# Patient Record
Sex: Male | Born: 1970 | Race: White | Hispanic: No | Marital: Married | Smoking: Never smoker
Health system: Southern US, Community
[De-identification: ages and names within clinical notes are randomized; demographics above are authoritative.]

## PROBLEM LIST (undated history)

## (undated) DIAGNOSIS — D751 Secondary polycythemia: Secondary | ICD-10-CM

## (undated) DIAGNOSIS — I219 Acute myocardial infarction, unspecified: Secondary | ICD-10-CM

## (undated) DIAGNOSIS — E785 Hyperlipidemia, unspecified: Secondary | ICD-10-CM

## (undated) DIAGNOSIS — R7989 Other specified abnormal findings of blood chemistry: Secondary | ICD-10-CM

## (undated) DIAGNOSIS — K589 Irritable bowel syndrome without diarrhea: Secondary | ICD-10-CM

## (undated) DIAGNOSIS — E291 Testicular hypofunction: Secondary | ICD-10-CM

## (undated) DIAGNOSIS — K2970 Gastritis, unspecified, without bleeding: Secondary | ICD-10-CM

## (undated) DIAGNOSIS — G589 Mononeuropathy, unspecified: Secondary | ICD-10-CM

## (undated) DIAGNOSIS — F553 Abuse of steroids or hormones: Secondary | ICD-10-CM

## (undated) DIAGNOSIS — E786 Lipoprotein deficiency: Secondary | ICD-10-CM

## (undated) DIAGNOSIS — I2510 Atherosclerotic heart disease of native coronary artery without angina pectoris: Secondary | ICD-10-CM

## (undated) HISTORY — DX: Lipoprotein deficiency: E78.6

## (undated) HISTORY — DX: Testicular hypofunction: E29.1

## (undated) HISTORY — DX: Gastritis, unspecified, without bleeding: K29.70

## (undated) HISTORY — DX: Acute myocardial infarction, unspecified: I21.9

## (undated) HISTORY — DX: Other specified abnormal findings of blood chemistry: R79.89

## (undated) HISTORY — PX: CORONARY ANGIOPLASTY WITH STENT PLACEMENT: SHX49

## (undated) HISTORY — DX: Secondary polycythemia: D75.1

## (undated) HISTORY — PX: INGUINAL HERNIA REPAIR: SHX194

## (undated) HISTORY — DX: Abuse of steroids or hormones: F55.3

## (undated) HISTORY — DX: Irritable bowel syndrome, unspecified: K58.9

---

## 1997-08-14 HISTORY — PX: NM RENAL LASIX (ARMC HX): HXRAD1213

## 2005-06-08 ENCOUNTER — Ambulatory Visit: Payer: Self-pay | Admitting: General Practice

## 2008-11-11 ENCOUNTER — Emergency Department: Payer: Self-pay | Admitting: Emergency Medicine

## 2010-07-14 ENCOUNTER — Ambulatory Visit: Payer: Self-pay | Admitting: Gastroenterology

## 2015-03-06 ENCOUNTER — Other Ambulatory Visit: Payer: Self-pay | Admitting: Urology

## 2015-03-06 DIAGNOSIS — E291 Testicular hypofunction: Secondary | ICD-10-CM

## 2015-03-15 ENCOUNTER — Other Ambulatory Visit: Payer: BLUE CROSS/BLUE SHIELD

## 2015-03-15 DIAGNOSIS — E291 Testicular hypofunction: Secondary | ICD-10-CM

## 2015-03-16 LAB — TESTOSTERONE: Testosterone: 308 ng/dL — ABNORMAL LOW (ref 348–1197)

## 2015-03-19 ENCOUNTER — Telehealth: Payer: Self-pay | Admitting: Urology

## 2015-03-19 DIAGNOSIS — E291 Testicular hypofunction: Secondary | ICD-10-CM

## 2015-03-19 NOTE — Telephone Encounter (Signed)
Please let this patient now that his testosterone is now 308. It is making a nice recovery with clomid alone up from 180 only 3 months ago.  I recommend continuing the course and we can repeat his testosterone again in 3 months. I would like to see him after that lab draw to see where he into plateauing. This is great news!    Hollice Espy, MD

## 2015-03-19 NOTE — Telephone Encounter (Signed)
LMOM

## 2015-03-20 NOTE — Telephone Encounter (Signed)
Pt returned call. Made him aware of lab results. Pt was very unhappy with continuing clomid therapy. Pt voiced concern about testing his testosterone every 74mo and then having to f/u. Pt stated this was the plan 9mo ago and the 101mo prior to that, therefore he wants to find another urologist.

## 2015-03-20 NOTE — Telephone Encounter (Signed)
Thanks totally fine.  I am happy to refer him elsewhere if he would like or he is welcome to come in and have a discussion in person if he would prefer.  Does he want to go to [place]?  We can refer him to [facility] or any other academic urology practice and send records.    Hollice Espy, MD

## 2015-03-20 NOTE — Telephone Encounter (Signed)
Spoke with pt and made aware of being able to refer him or make an appt to speak with Dr. Erlene Quan. Pt stated that he would call back when he makes a final decision. Cw,lpn

## 2015-09-08 ENCOUNTER — Encounter (HOSPITAL_COMMUNITY)
Admission: EM | Disposition: A | Discharge status: Home / Self Care | Payer: Self-pay | Source: Home / Self Care | Attending: Cardiology

## 2015-09-08 ENCOUNTER — Inpatient Hospital Stay (HOSPITAL_COMMUNITY)
Admission: EM | Admit: 2015-09-08 | Discharge: 2015-09-10 | DRG: 247 | Disposition: A | Discharge status: Home / Self Care | Payer: BLUE CROSS/BLUE SHIELD | Attending: Cardiology | Admitting: Cardiology

## 2015-09-08 ENCOUNTER — Encounter (HOSPITAL_COMMUNITY): Payer: Self-pay | Admitting: Physician Assistant

## 2015-09-08 ENCOUNTER — Inpatient Hospital Stay (HOSPITAL_COMMUNITY): Payer: BLUE CROSS/BLUE SHIELD

## 2015-09-08 DIAGNOSIS — I472 Ventricular tachycardia: Secondary | ICD-10-CM | POA: Diagnosis not present

## 2015-09-08 DIAGNOSIS — E785 Hyperlipidemia, unspecified: Secondary | ICD-10-CM | POA: Diagnosis present

## 2015-09-08 DIAGNOSIS — I213 ST elevation (STEMI) myocardial infarction of unspecified site: Secondary | ICD-10-CM

## 2015-09-08 DIAGNOSIS — I2510 Atherosclerotic heart disease of native coronary artery without angina pectoris: Secondary | ICD-10-CM | POA: Diagnosis not present

## 2015-09-08 DIAGNOSIS — R030 Elevated blood-pressure reading, without diagnosis of hypertension: Secondary | ICD-10-CM | POA: Diagnosis present

## 2015-09-08 DIAGNOSIS — E7849 Other hyperlipidemia: Secondary | ICD-10-CM | POA: Diagnosis present

## 2015-09-08 DIAGNOSIS — I2119 ST elevation (STEMI) myocardial infarction involving other coronary artery of inferior wall: Secondary | ICD-10-CM | POA: Diagnosis present

## 2015-09-08 DIAGNOSIS — IMO0001 Reserved for inherently not codable concepts without codable children: Secondary | ICD-10-CM

## 2015-09-08 DIAGNOSIS — I2111 ST elevation (STEMI) myocardial infarction involving right coronary artery: Secondary | ICD-10-CM | POA: Diagnosis not present

## 2015-09-08 DIAGNOSIS — Z880 Allergy status to penicillin: Secondary | ICD-10-CM

## 2015-09-08 DIAGNOSIS — N179 Acute kidney failure, unspecified: Secondary | ICD-10-CM | POA: Diagnosis present

## 2015-09-08 DIAGNOSIS — Z8249 Family history of ischemic heart disease and other diseases of the circulatory system: Secondary | ICD-10-CM | POA: Diagnosis not present

## 2015-09-08 DIAGNOSIS — R079 Chest pain, unspecified: Secondary | ICD-10-CM | POA: Diagnosis not present

## 2015-09-08 DIAGNOSIS — R072 Precordial pain: Secondary | ICD-10-CM | POA: Diagnosis present

## 2015-09-08 DIAGNOSIS — Z955 Presence of coronary angioplasty implant and graft: Secondary | ICD-10-CM

## 2015-09-08 DIAGNOSIS — I25700 Atherosclerosis of coronary artery bypass graft(s), unspecified, with unstable angina pectoris: Secondary | ICD-10-CM | POA: Diagnosis not present

## 2015-09-08 HISTORY — DX: Hyperlipidemia, unspecified: E78.5

## 2015-09-08 HISTORY — DX: Atherosclerotic heart disease of native coronary artery without angina pectoris: I25.10

## 2015-09-08 HISTORY — DX: Mononeuropathy, unspecified: G58.9

## 2015-09-08 HISTORY — PX: CARDIAC CATHETERIZATION: SHX172

## 2015-09-08 LAB — CBC
HEMATOCRIT: 54.5 % — AB (ref 39.0–52.0)
HEMATOCRIT: 52.3 % — AB (ref 39.0–52.0)
HEMOGLOBIN: 17.8 g/dL — AB (ref 13.0–17.0)
Hemoglobin: 18.5 g/dL — ABNORMAL HIGH (ref 13.0–17.0)
MCH: 30.8 pg (ref 26.0–34.0)
MCH: 30.7 pg (ref 26.0–34.0)
MCHC: 33.9 g/dL (ref 30.0–36.0)
MCHC: 34 g/dL (ref 30.0–36.0)
MCV: 90.7 fL (ref 78.0–100.0)
MCV: 90.2 fL (ref 78.0–100.0)
Platelets: 250 10*3/uL (ref 150–400)
Platelets: 203 10*3/uL (ref 150–400)
RBC: 6.01 MIL/uL — ABNORMAL HIGH (ref 4.22–5.81)
RBC: 5.8 MIL/uL (ref 4.22–5.81)
RDW: 13.1 % (ref 11.5–15.5)
RDW: 13 % (ref 11.5–15.5)
WBC: 12.1 10*3/uL — AB (ref 4.0–10.5)
WBC: 18.2 10*3/uL — ABNORMAL HIGH (ref 4.0–10.5)

## 2015-09-08 LAB — BASIC METABOLIC PANEL
Anion gap: 10 (ref 5–15)
BUN: 14 mg/dL (ref 6–20)
CHLORIDE: 104 mmol/L (ref 101–111)
CO2: 24 mmol/L (ref 22–32)
Calcium: 9.4 mg/dL (ref 8.9–10.3)
Creatinine, Ser: 1.33 mg/dL — ABNORMAL HIGH (ref 0.61–1.24)
GFR calc Af Amer: >60 mL/min (ref >60)
GFR calc non Af Amer: >60 mL/min (ref >60)
Glucose, Bld: 160 mg/dL — ABNORMAL HIGH (ref 65–99)
POTASSIUM: 3.8 mmol/L (ref 3.5–5.1)
SODIUM: 138 mmol/L (ref 135–145)

## 2015-09-08 LAB — TROPONIN I
TROPONIN I: 0.16 ng/mL — AB (ref <0.031)
TROPONIN I: 16.24 ng/mL — AB (ref <0.031)
Troponin I: 10.01 ng/mL (ref <0.031)

## 2015-09-08 LAB — MRSA PCR SCREENING: MRSA by PCR: NEGATIVE

## 2015-09-08 LAB — POCT I-STAT, CHEM 8
BUN: 16 mg/dL (ref 6–20)
CHLORIDE: 102 mmol/L (ref 101–111)
CREATININE: 1.1 mg/dL (ref 0.61–1.24)
Calcium, Ion: 1.25 mmol/L — ABNORMAL HIGH (ref 1.12–1.23)
GLUCOSE: 182 mg/dL — AB (ref 65–99)
HEMATOCRIT: 56 % — AB (ref 39.0–52.0)
HEMOGLOBIN: 19 g/dL — AB (ref 13.0–17.0)
POTASSIUM: 3.5 mmol/L (ref 3.5–5.1)
Sodium: 140 mmol/L (ref 135–145)
TCO2: 23 mmol/L (ref 0–100)

## 2015-09-08 LAB — POCT ACTIVATED CLOTTING TIME: ACTIVATED CLOTTING TIME: 611 s

## 2015-09-08 LAB — PROTIME-INR
INR: 1.04 (ref 0.00–1.49)
Prothrombin Time: 13.8 seconds (ref 11.6–15.2)

## 2015-09-08 LAB — CREATININE, SERUM
CREATININE: 1.19 mg/dL (ref 0.61–1.24)
GFR calc Af Amer: >60 mL/min (ref >60)
GFR calc non Af Amer: >60 mL/min (ref >60)

## 2015-09-08 LAB — TSH: TSH: 1.032 u[IU]/mL (ref 0.350–4.500)

## 2015-09-08 LAB — GLUCOSE, CAPILLARY: GLUCOSE-CAPILLARY: 106 mg/dL — AB (ref 65–99)

## 2015-09-08 LAB — MAGNESIUM: MAGNESIUM: 1.8 mg/dL (ref 1.7–2.4)

## 2015-09-08 SURGERY — LEFT HEART CATH AND CORONARY ANGIOGRAPHY
Anesthesia: LOCAL

## 2015-09-08 MED ORDER — METOPROLOL TARTRATE 25 MG PO TABS
25.0000 mg | ORAL_TABLET | Freq: Two times a day (BID) | ORAL | Status: DC
  Administered 2015-09-08 – 2015-09-10 (×5): 25 mg via ORAL
  Filled 2015-09-08 (×5): qty 1

## 2015-09-08 MED ORDER — MIDAZOLAM HCL 2 MG/2ML IJ SOLN
INTRAMUSCULAR | Status: AC
  Filled 2015-09-08: qty 2

## 2015-09-08 MED ORDER — LIDOCAINE HCL (PF) 1 % IJ SOLN
INTRAMUSCULAR | Status: AC
  Filled 2015-09-08: qty 30

## 2015-09-08 MED ORDER — TICAGRELOR 90 MG PO TABS
ORAL_TABLET | ORAL | Status: DC | PRN
  Administered 2015-09-08: 180 mg via ORAL

## 2015-09-08 MED ORDER — SODIUM CHLORIDE 0.9 % IJ SOLN: 3.0000 mL | INTRAMUSCULAR | Status: DC | PRN

## 2015-09-08 MED ORDER — ONDANSETRON HCL 4 MG/2ML IJ SOLN: 4.0000 mg | Freq: Four times a day (QID) | INTRAMUSCULAR | Status: DC | PRN

## 2015-09-08 MED ORDER — SODIUM CHLORIDE 0.9 % WEIGHT BASED INFUSION
1.0000 mL/kg/h | INTRAVENOUS | Status: DC
  Administered 2015-09-08: 1 mL/kg/h via INTRAVENOUS

## 2015-09-08 MED ORDER — SODIUM CHLORIDE 0.9 % IV SOLN: 250.0000 mL | INTRAVENOUS | Status: DC | PRN

## 2015-09-08 MED ORDER — VERAPAMIL HCL 2.5 MG/ML IV SOLN
INTRAVENOUS | Status: AC
  Filled 2015-09-08: qty 2

## 2015-09-08 MED ORDER — FENTANYL CITRATE (PF) 100 MCG/2ML IJ SOLN
INTRAMUSCULAR | Status: AC
  Filled 2015-09-08: qty 2

## 2015-09-08 MED ORDER — HEPARIN SODIUM (PORCINE) 5000 UNIT/ML IJ SOLN
5000.0000 [IU] | Freq: Three times a day (TID) | INTRAMUSCULAR | Status: DC
  Administered 2015-09-08 – 2015-09-10 (×6): 5000 [IU] via SUBCUTANEOUS
  Filled 2015-09-08 (×5): qty 1

## 2015-09-08 MED ORDER — ACETAMINOPHEN 325 MG PO TABS
650.0000 mg | ORAL_TABLET | ORAL | Status: DC | PRN
  Administered 2015-09-08 – 2015-09-10 (×2): 650 mg via ORAL
  Filled 2015-09-08 (×2): qty 2

## 2015-09-08 MED ORDER — ATORVASTATIN CALCIUM 80 MG PO TABS
80.0000 mg | ORAL_TABLET | Freq: Every day | ORAL | Status: DC
  Administered 2015-09-08 – 2015-09-09 (×2): 80 mg via ORAL
  Filled 2015-09-08 (×2): qty 1

## 2015-09-08 MED ORDER — VERAPAMIL HCL 2.5 MG/ML IV SOLN
INTRAVENOUS | Status: DC | PRN
  Administered 2015-09-08: 08:00:00 via INTRA_ARTERIAL

## 2015-09-08 MED ORDER — FENTANYL CITRATE (PF) 100 MCG/2ML IJ SOLN
INTRAMUSCULAR | Status: DC | PRN
  Administered 2015-09-08 (×2): 25 ug via INTRAVENOUS

## 2015-09-08 MED ORDER — MIDAZOLAM HCL 2 MG/2ML IJ SOLN
INTRAMUSCULAR | Status: DC | PRN
  Administered 2015-09-08 (×2): 1 mg via INTRAVENOUS

## 2015-09-08 MED ORDER — HEPARIN SODIUM (PORCINE) 5000 UNIT/ML IJ SOLN
INTRAMUSCULAR | Status: AC
  Filled 2015-09-08: qty 1

## 2015-09-08 MED ORDER — BIVALIRUDIN BOLUS VIA INFUSION - CUPID
INTRAVENOUS | Status: DC | PRN
  Administered 2015-09-08: 80.25 mg via INTRAVENOUS

## 2015-09-08 MED ORDER — SODIUM CHLORIDE 0.9 % IV SOLN
INTRAVENOUS | Status: DC | PRN
  Administered 2015-09-08: 100 mL/h via INTRAVENOUS

## 2015-09-08 MED ORDER — TICAGRELOR 90 MG PO TABS
90.0000 mg | ORAL_TABLET | Freq: Two times a day (BID) | ORAL | Status: DC
  Administered 2015-09-08 – 2015-09-10 (×5): 90 mg via ORAL
  Filled 2015-09-08 (×5): qty 1

## 2015-09-08 MED ORDER — IOHEXOL 350 MG/ML SOLN
INTRAVENOUS | Status: DC | PRN
  Administered 2015-09-08: 120 mL via INTRA_ARTERIAL

## 2015-09-08 MED ORDER — TICAGRELOR 90 MG PO TABS
ORAL_TABLET | ORAL | Status: AC
  Filled 2015-09-08: qty 1

## 2015-09-08 MED ORDER — NITROGLYCERIN 1 MG/10 ML FOR IR/CATH LAB
INTRA_ARTERIAL | Status: DC | PRN
  Administered 2015-09-08: 08:00:00

## 2015-09-08 MED ORDER — SODIUM CHLORIDE 0.9 % IJ SOLN
3.0000 mL | Freq: Two times a day (BID) | INTRAMUSCULAR | Status: DC
  Administered 2015-09-08 – 2015-09-10 (×4): 3 mL via INTRAVENOUS

## 2015-09-08 MED ORDER — NITROGLYCERIN 1 MG/10 ML FOR IR/CATH LAB
INTRA_ARTERIAL | Status: AC
  Filled 2015-09-08: qty 10

## 2015-09-08 MED ORDER — POTASSIUM CHLORIDE CRYS ER 20 MEQ PO TBCR
40.0000 meq | EXTENDED_RELEASE_TABLET | Freq: Once | ORAL | Status: AC
  Administered 2015-09-08: 40 meq via ORAL
  Filled 2015-09-08: qty 2

## 2015-09-08 MED ORDER — ACETAMINOPHEN 325 MG PO TABS: 650.0000 mg | ORAL_TABLET | ORAL | Status: DC | PRN

## 2015-09-08 MED ORDER — HEPARIN (PORCINE) IN NACL 2-0.9 UNIT/ML-% IJ SOLN
INTRAMUSCULAR | Status: AC
  Filled 2015-09-08: qty 1000

## 2015-09-08 MED ORDER — HEPARIN SODIUM (PORCINE) 5000 UNIT/ML IJ SOLN
4000.0000 [IU] | Freq: Once | INTRAMUSCULAR | Status: AC
  Administered 2015-09-08: 4000 [IU] via INTRAVENOUS

## 2015-09-08 MED ORDER — SODIUM CHLORIDE 0.9 % IV SOLN
250.0000 mg | INTRAVENOUS | Status: DC | PRN
  Administered 2015-09-08: 1.75 mg/kg/h via INTRAVENOUS

## 2015-09-08 MED ORDER — BIVALIRUDIN 250 MG IV SOLR
INTRAVENOUS | Status: AC
  Filled 2015-09-08: qty 250

## 2015-09-08 MED ORDER — ASPIRIN 81 MG PO CHEW
81.0000 mg | CHEWABLE_TABLET | Freq: Every day | ORAL | Status: DC
  Administered 2015-09-08 – 2015-09-10 (×3): 81 mg via ORAL
  Filled 2015-09-08 (×3): qty 1

## 2015-09-08 MED ORDER — TRAMADOL HCL 50 MG PO TABS
50.0000 mg | ORAL_TABLET | Freq: Two times a day (BID) | ORAL | Status: DC | PRN
  Administered 2015-09-08 – 2015-09-09 (×2): 50 mg via ORAL
  Filled 2015-09-08 (×2): qty 1

## 2015-09-08 SURGICAL SUPPLY — 19 items
BALLN EUPHORA RX 2.5X12 (BALLOONS) ×2
BALLN ~~LOC~~ EUPHORA RX 4.0X15 (BALLOONS) ×2
BALLOON EUPHORA RX 2.5X12 (BALLOONS) ×1 IMPLANT
BALLOON ~~LOC~~ EUPHORA RX 4.0X15 (BALLOONS) ×1 IMPLANT
CATH INFINITI 5 FR JL3.5 (CATHETERS) ×2 IMPLANT
CATH INFINITI 5FR ANG PIGTAIL (CATHETERS) ×2 IMPLANT
CATH INFINITI JR4 5F (CATHETERS) ×2 IMPLANT
CATH VISTA GUIDE 6FR JR4 (CATHETERS) ×2 IMPLANT
DEVICE RAD COMP TR BAND LRG (VASCULAR PRODUCTS) ×2 IMPLANT
GLIDESHEATH SLEND SS 6F .021 (SHEATH) ×2 IMPLANT
KIT ENCORE 26 ADVANTAGE (KITS) ×2 IMPLANT
KIT HEART LEFT (KITS) ×2 IMPLANT
PACK CARDIAC CATHETERIZATION (CUSTOM PROCEDURE TRAY) ×2 IMPLANT
STENT PROMUS PREM MR 3.5X24 (Permanent Stent) ×2 IMPLANT
SYR MEDRAD MARK V 150ML (SYRINGE) ×2 IMPLANT
TRANSDUCER W/STOPCOCK (MISCELLANEOUS) ×2 IMPLANT
TUBING CIL FLEX 10 FLL-RA (TUBING) ×2 IMPLANT
WIRE ASAHI PROWATER 180CM (WIRE) ×2 IMPLANT
WIRE SAFE-T 1.5MM-J .035X260CM (WIRE) ×2 IMPLANT

## 2015-09-08 NOTE — Progress Notes (Signed)
CRITICAL VALUE ALERT  Critical value received: Troponin 5.03  Date of notification:  09/08/2015  Time of notification:  [id number]  Critical value read back:Yes.    Nurse who received alert:  Marta Lamas RN  MD notified (1st page):  Hal Meng PA-C  Time of first page:  1330  MD notified (2nd page):  Time of second page:  Responding MD:  Loma Sousa PA-c  Time MD responded:  [phone]

## 2015-09-08 NOTE — H&P (Signed)
History and Physical  Patient ID: Benjamin Carrillo MRN: [id number], DOB: 1970/09/20 Date of Encounter: 09/08/2015, 7:35 AM Primary Physician: Everlean Alstrom, MD Primary Cardiologist: New (came from [facility] area)  Chief Complaint: chest pain, nausea Reason for Admission: STEMI Requesting MD: [facility]/Dr. 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 This morning around 5-5:30am, he awoke and felt nauseated. He ate a peanut butter and jelly sandwich then began developing substernal chest pain and jaw pain. He took 2 Goody powders (each containing 325mg  ASA). When pain did not resolve, he called 911. Initial BPs were 135/88->150/100->142/86. EKG in the field showed ST elevation in leads III, avF with reciprocal ST depression in I, avL. Code STEMI was called and he was brought to the [facility] lab emergently. He received 2 SL NTG sprays and 140mcg of fentanyl without significant relief of pain. He denies any history of tobacco or drug use. + Family history of CAD in both grandfathers. Rare EtOH. He does report drinking a lot of energy drinks and using thermal cut products for weightlifting.   Past Medical History  Diagnosis Date  . Pinched nerve     L4    Surgical History:  Past Surgical History  Procedure Laterality Date  . Inguinal hernia repair       Home Meds: Prior to Admission medications   Not on File    Allergies:  Allergies  Allergen Reactions  . Erythromycin   . Penicillins     Social History   Social History  . Marital Status: Married    Spouse Name: N/A  . Number of Children: N/A  . Years of Education: N/A   Occupational History  . Firefighter    Social History Main Topics  . Smoking status: Never Smoker   . Smokeless tobacco: Not on file  . Alcohol Use: 0.0 oz/week    0 Standard drinks or equivalent per week   Comment: Rare  . Drug Use: No  . Sexual Activity: Not on file   Other Topics Concern  . Not on file   Social History Narrative  . No narrative on file     Family History  Problem Relation Age of Onset  . CAD Maternal Grandfather   . CAD Paternal Grandfather     Review of Systems: No h/o TIA/stroke or bleeding. All other systems reviewed and are otherwise negative except as noted above.  Labs: pending  Radiology/Studies:  None yet  EKG:  EMS: NSR 82bpm, ST elevation III, avF (up to 2 mm in lead III), mild ST depression I, avL F/u EKG in ER: NSR 90bpm, improved ST elevation inferiorly, mild ST depression I, avL with downsloped TW avL  Physical Exam: Pulse 90, BP 142/86, RR 14 SpO2 96 %. General: Well developed, well nourished WM, uncomfortable appearing Head: Normocephalic, atraumatic, sclera non-icteric, no xanthomas, nares are without discharge.  Neck: JVD not elevated. Lungs: Clear bilaterally to auscultation without wheezes, rales, or rhonchi. Breathing is unlabored. Heart: RRR with S1 S2. No murmurs, rubs, or gallops appreciated. Abdomen: Soft, non-tender, non-distended with normoactive bowel sounds. No hepatomegaly. No rebound/guarding. No obvious abdominal masses. Msk:  Strength and tone appear normal for age. Extremities: No clubbing or cyanosis. No edema.  Distal pedal pulses are 2+ and equal bilaterally. Neuro: Alert and oriented X 3. No focal deficit. No facial asymmetry. Moves all extremities  spontaneously. Psych:  Responds to questions appropriately with a normal affect.    ASSESSMENT AND PLAN:   1. Inferior ST elevation MI/probable CAD - the patient was taken emergently to the cath lab with plans for urgent coronary angiogram. Anticipate standard post-MI therapy. Dr. Doug Sou post-cath orders will include antiplatelet therapy. Per our preliminary discussion will initiate high dose statin and metoprolol 25mg  BID. Anticipate echocardiogram to be ordered tomorrow.  Check labs including troponins, A1C, lipid panel, TSH.  2. Mildly elevated BP - will trend with addition of beta blocker. No formal hx of HTN.  Signed, Charlie Pitter PA-C 09/08/2015, 7:35 AM Pager: [phone]  Patient seen and examined and history reviewed. Agree with above findings and plan. 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 Began having pain at 5:30 associated with jaw pain, SOB, and diaphoresis. Ecg shows ST elevation in leads 3 and Avf consistent with acute inferior injury. Otherwise very healthy. Will take for emergent cardiac cath and PCI.   Rhaya Coale [place], [place] 09/08/2015 8:23 AM

## 2015-09-08 NOTE — Progress Notes (Signed)
Patient called nurse into room stating that he had a brief episode of jaw pain associated wit some chest discomfort. Nurse reviewed telemetry alarms which show approximately 11 beat run of v tach.  Eulas Post PA for cardiology made aware. Will continue to monitor the  patient

## 2015-09-08 NOTE — Progress Notes (Signed)
   09/08/15 0800  Clinical Encounter Type  Visited With Family;Patient not available  Visit Type Code;Initial;Patient in surgery  Referral From Nurse  Consult/Referral To Chaplain  Spiritual Encounters  Spiritual Needs Emotional  CH met and escorted wife to [place] waiting area; [facility] notified CATH team and 2H of wife location; emotional and spiritual support provided. 8:18 AM Gwynn Burly

## 2015-09-09 DIAGNOSIS — I472 Ventricular tachycardia: Secondary | ICD-10-CM

## 2015-09-09 DIAGNOSIS — R030 Elevated blood-pressure reading, without diagnosis of hypertension: Secondary | ICD-10-CM

## 2015-09-09 LAB — LIPID PANEL
Cholesterol: 141 mg/dL (ref 0–200)
HDL: 27 mg/dL — AB (ref >40)
LDL Cholesterol: 89 mg/dL (ref 0–99)
Total CHOL/HDL Ratio: 5.2 RATIO
Triglycerides: 126 mg/dL (ref <150)
VLDL: 25 mg/dL (ref 0–40)

## 2015-09-09 LAB — COMPREHENSIVE METABOLIC PANEL
ALK PHOS: 48 U/L (ref 38–126)
ALT: 66 U/L — AB (ref 17–63)
AST: 120 U/L — AB (ref 15–41)
Albumin: 3.1 g/dL — ABNORMAL LOW (ref 3.5–5.0)
Anion gap: 8 (ref 5–15)
BILIRUBIN TOTAL: 1.3 mg/dL — AB (ref 0.3–1.2)
BUN: 8 mg/dL (ref 6–20)
CHLORIDE: 109 mmol/L (ref 101–111)
CO2: 22 mmol/L (ref 22–32)
CREATININE: 1.2 mg/dL (ref 0.61–1.24)
Calcium: 8.7 mg/dL — ABNORMAL LOW (ref 8.9–10.3)
GFR calc Af Amer: >60 mL/min (ref >60)
Glucose, Bld: 122 mg/dL — ABNORMAL HIGH (ref 65–99)
Potassium: 4.3 mmol/L (ref 3.5–5.1)
Sodium: 139 mmol/L (ref 135–145)
TOTAL PROTEIN: 5.2 g/dL — AB (ref 6.5–8.1)

## 2015-09-09 LAB — CBC
HCT: 51.1 % (ref 39.0–52.0)
Hemoglobin: 17.1 g/dL — ABNORMAL HIGH (ref 13.0–17.0)
MCH: 30.3 pg (ref 26.0–34.0)
MCHC: 33.5 g/dL (ref 30.0–36.0)
MCV: 90.6 fL (ref 78.0–100.0)
PLATELETS: 200 10*3/uL (ref 150–400)
RBC: 5.64 MIL/uL (ref 4.22–5.81)
RDW: 13.3 % (ref 11.5–15.5)
WBC: 13.4 10*3/uL — AB (ref 4.0–10.5)

## 2015-09-09 NOTE — Care Management Note (Signed)
Case Management Note  Patient Details  Name: Benjamin Carrillo MRN: [id number] Date of Birth: Dec 11, 1970  Subjective/Objective:       Adm w mi             Action/Plan:lives w fam, pcp dr Lenna Sciara [place]   Expected Discharge Date:                  Expected Discharge Plan:  Home/Self Care  In-House Referral:     Discharge planning Services  CM Consult, Medication Assistance  Post Acute Care Choice:    Choice offered to:     DME Arranged:    DME Agency:     HH Arranged:    [place] Agency:     Status of Service:     Medicare Important Message Given:    Date Medicare IM Given:    Medicare IM give by:    Date Additional Medicare IM Given:    Additional Medicare Important Message give by:     If discussed at [place] of Stay Meetings, dates discussed:    Additional Comments:ur review done, gave pt 30day free and copay card for brilinta. Pt states has ins for meds.  Lacretia Leigh, RN 09/09/2015, 10:24 AM

## 2015-09-09 NOTE — Progress Notes (Signed)
TELEMETRY: Reviewed telemetry pt in NSR with short runs of NSVT: Filed Vitals:   09/09/15 0200 09/09/15 0300 09/09/15 0500 09/09/15 0600  BP: 109/76 108/67 93/52 109/77  Pulse: 93 76 77 81  Temp:   98.6 F (37 C)   TempSrc:   Oral   Resp: 19 19 14 12   Height:      Weight:   107.3 kg (236 lb 8.9 oz)   SpO2: 96% 96% 96% 97%    Intake/Output Summary (Last 24 hours) at 09/09/15 0701 Last data filed at 09/08/15 1800  Gross per 24 hour  Intake    240 ml  Output      0 ml  Net    240 ml   Filed Weights   09/08/15 0842 09/09/15 0500  Weight: 107.7 kg (237 lb 7 oz) 107.3 kg (236 lb 8.9 oz)    Subjective Feels well. No chest pain or SOB. Some HA.  Marland Kitchen aspirin  81 mg Oral Daily  . atorvastatin  80 mg Oral q1800  . heparin  5,000 Units Subcutaneous 3 times per day  . metoprolol tartrate  25 mg Oral BID  . sodium chloride  3 mL Intravenous Q12H  . ticagrelor  90 mg Oral BID      LABS: Basic Metabolic Panel:  Recent Labs  09/08/15 0725 09/08/15 0750 09/08/15 1120 09/08/15 1520 09/09/15 0423  NA 138 140  --   --  139  K 3.8 3.5  --   --  4.3  CL 104 102  --   --  109  CO2 24  --   --   --  22  GLUCOSE 160* 182*  --   --  122*  BUN 14 16  --   --  8  CREATININE 1.33* 1.10 1.19  --  1.20  CALCIUM 9.4  --   --   --  8.7*  MG  --   --   --  1.8  --    Liver Function Tests:  Recent Labs  09/09/15 0423  AST 120*  ALT 66*  ALKPHOS 48  BILITOT 1.3*  PROT 5.2*  ALBUMIN 3.1*   No results for input(s): LIPASE, AMYLASE in the last 72 hours. CBC:  Recent Labs  09/08/15 1120 09/09/15 0423  WBC 18.2* 13.4*  HGB 17.8* 17.1*  HCT 52.3* 51.1  MCV 90.2 90.6  PLT 203 200   Cardiac Enzymes:  Recent Labs  09/08/15 1120 09/08/15 1520 09/08/15 2017  TROPONINI 5.03* 10.01* 16.24*   BNP: No results for input(s): PROBNP in the last 72 hours. D-Dimer: No results for input(s): DDIMER in the last 72 hours. Hemoglobin A1C: No results for input(s): HGBA1C in the  last 72 hours. Fasting Lipid Panel:  Recent Labs  09/09/15 0423  CHOL 141  HDL 27*  LDLCALC 89  TRIG 126  CHOLHDL 5.2   Thyroid Function Tests:  Recent Labs  09/08/15 1120  TSH 1.032     Radiology/Studies:  Portable Chest X-ray 1 View  09/08/2015  CLINICAL DATA:  44 year old male status post ST elevation myocardial infarction EXAM: PORTABLE CHEST 1 VIEW COMPARISON:  None. FINDINGS: The lungs are clear and negative for focal airspace consolidation, pulmonary edema or suspicious pulmonary nodule. No pleural effusion or pneumothorax. Cardiac and mediastinal contours are within normal limits. No acute fracture or lytic or blastic osseous lesions. The visualized upper abdominal bowel gas pattern is unremarkable. IMPRESSION: No active cardiopulmonary disease. Electronically Signed   By: Myrle Sheng  Laurence Ferrari M.D.   On: 09/08/2015 10:23   Ecg shows NSR with normal Ecg. ST elevation resolved.   PHYSICAL EXAM General: Well developed, well nourished, in no acute distress. Head: Normocephalic, atraumatic, sclera non-icteric, oropharynx is clear Neck: Negative for carotid bruits. JVD not elevated. No adenopathy Lungs: Clear bilaterally to auscultation without wheezes, rales, or rhonchi. Breathing is unlabored. Heart: RRR S1 S2 without murmurs, rubs, or gallops.  Abdomen: Soft, non-tender, non-distended with normoactive bowel sounds. No hepatomegaly. No rebound/guarding. No obvious abdominal masses. Msk:  Strength and tone appears normal for age. Extremities: No clubbing, cyanosis or edema.  Distal pedal pulses are 2+ and equal bilaterally. Neuro: Alert and oriented X 3. Moves all extremities spontaneously. Psych:  Responds to questions appropriately with a normal affect.  ASSESSMENT AND PLAN: 1. Acute inferior STEMI. S/p DES of mid RCA. DAPT for one year. Continue high dose statin, beta blocker as BP allows. Transfer to floor today. Anticipate DC in am.  2. Dyslipidemia. On high dose  statin. 3. NSVT secondary to reperfusion. Continue beta blocker and monitor.   Present on Admission:  . Acute ST elevation myocardial infarction (STEMI) ([place]) . ST elevation (STEMI) myocardial infarction involving right coronary artery ([place])  Signed, Peter Martinique, [place] 09/09/2015 7:01 AM

## 2015-09-10 ENCOUNTER — Telehealth: Payer: Self-pay | Admitting: Physician Assistant

## 2015-09-10 ENCOUNTER — Ambulatory Visit (HOSPITAL_COMMUNITY): Payer: BLUE CROSS/BLUE SHIELD

## 2015-09-10 ENCOUNTER — Encounter (HOSPITAL_COMMUNITY): Payer: Self-pay | Admitting: Cardiology

## 2015-09-10 DIAGNOSIS — I2510 Atherosclerotic heart disease of native coronary artery without angina pectoris: Secondary | ICD-10-CM | POA: Diagnosis present

## 2015-09-10 DIAGNOSIS — I25700 Atherosclerosis of coronary artery bypass graft(s), unspecified, with unstable angina pectoris: Secondary | ICD-10-CM

## 2015-09-10 DIAGNOSIS — E7849 Other hyperlipidemia: Secondary | ICD-10-CM | POA: Diagnosis present

## 2015-09-10 DIAGNOSIS — E785 Hyperlipidemia, unspecified: Secondary | ICD-10-CM

## 2015-09-10 DIAGNOSIS — R079 Chest pain, unspecified: Secondary | ICD-10-CM

## 2015-09-10 LAB — COMPREHENSIVE METABOLIC PANEL
ALK PHOS: 51 U/L (ref 38–126)
ALT: 60 U/L (ref 17–63)
AST: 59 U/L — AB (ref 15–41)
Albumin: 3.2 g/dL — ABNORMAL LOW (ref 3.5–5.0)
Anion gap: 9 (ref 5–15)
BILIRUBIN TOTAL: 0.7 mg/dL (ref 0.3–1.2)
BUN: 9 mg/dL (ref 6–20)
CALCIUM: 9.2 mg/dL (ref 8.9–10.3)
CO2: 24 mmol/L (ref 22–32)
Chloride: 107 mmol/L (ref 101–111)
Creatinine, Ser: 1.31 mg/dL — ABNORMAL HIGH (ref 0.61–1.24)
GFR calc Af Amer: >60 mL/min (ref >60)
GLUCOSE: 86 mg/dL (ref 65–99)
POTASSIUM: 4.4 mmol/L (ref 3.5–5.1)
Sodium: 140 mmol/L (ref 135–145)
TOTAL PROTEIN: 5.6 g/dL — AB (ref 6.5–8.1)

## 2015-09-10 LAB — HEMOGLOBIN A1C
HEMOGLOBIN A1C: 5.6 % (ref 4.8–5.6)
MEAN PLASMA GLUCOSE: 114 mg/dL

## 2015-09-10 MED ORDER — TICAGRELOR 90 MG PO TABS: 90.0000 mg | ORAL_TABLET | Freq: Two times a day (BID) | ORAL | Status: DC

## 2015-09-10 MED ORDER — ASPIRIN 81 MG PO CHEW: 81.0000 mg | CHEWABLE_TABLET | Freq: Every day | ORAL | Status: AC

## 2015-09-10 MED ORDER — METOPROLOL TARTRATE 25 MG PO TABS: 25.0000 mg | ORAL_TABLET | Freq: Two times a day (BID) | ORAL | Status: DC

## 2015-09-10 MED ORDER — NITROGLYCERIN 0.4 MG SL SUBL: 0.4000 mg | SUBLINGUAL_TABLET | SUBLINGUAL | Status: DC | PRN

## 2015-09-10 MED ORDER — ATORVASTATIN CALCIUM 80 MG PO TABS: 80.0000 mg | ORAL_TABLET | Freq: Every day | ORAL | Status: DC

## 2015-09-10 NOTE — Care Management Note (Signed)
Case Management Note  Patient Details  Name: Benjamin Carrillo MRN: [id number] Date of Birth: June 17, 1971  Subjective/Objective:     Pt admitted with STEMI               Action/Plan:  Pt is independent from home with wife.  Pt will discharge home on [place].  CM will assist with medication initiation post discharge   Expected Discharge Date:                  Expected Discharge Plan:  Home/Self Care  In-House Referral:     Discharge planning Services  CM Consult, Medication Assistance  Post Acute Care Choice:    Choice offered to:     DME Arranged:    DME Agency:     HH Arranged:    [place] Agency:     Status of Service:     Medicare Important Message Given:    Date Medicare IM Given:    Medicare IM give by:    Date Additional Medicare IM Given:    Additional Medicare Important Message give by:     If discussed at [place] of Stay Meetings, dates discussed:    Additional Comments: CM verified that pt does have both free 30 day card and reduced copay card.  Pt informed CM that [facility] [address] in [place], [place] contacted pharmacy and was informed that prescription can be filled on day of discharge Maryclare Labrador, RN 09/10/2015, 10:36 AM

## 2015-09-10 NOTE — Telephone Encounter (Signed)
Patient's wife called answering svs. She noticed NTG wasn't included in his list of prescriptions and requested rx. Chart reviewed - appropriate to prescribe. [facility] to [facility] [place]. She verbalized gratitude. Markeria Goetsch PA-C

## 2015-09-10 NOTE — Discharge Summary (Signed)
Discharge Summary   Patient ID: Benjamin Carrillo MRN: [id number], DOB/AGE: 04-23-1971 44 y.o. Admit date: 09/08/2015 D/C date:     09/10/2015  Primary Cardiologist: Dr. 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  The morning of admission he awoke and felt nauseated. He ate a peanut butter and jelly sandwich then began developing substernal chest pain and jaw pain. He took 2 Goody powders (each containing 325mg  ASA). When pain did not resolve, he called 911. Initial BPs were 135/88->150/100->142/86. EKG in the field showed ST elevation in leads III, avF with reciprocal ST depression in I, avL. Code STEMI was called and he was brought to the [facility] lab emergently. He received 2 SL NTG sprays and 175mcg of fentanyl without significant relief of pain. He denied any history of tobacco or drug use. + Family history of CAD in both grandfathers. Rare EtOH. He does report drinking a lot of energy drinks and using thermal cut products for weightlifting. He was taken back for emergent cardiac catheterization on 09/08/15 and found to have single vessel occlusive CAD in RCA s/p successful stenting of the mid RCA with a DES. He had good LV function. 2D ECHO 09/10/15:  Normal LV systolic function; [id number]; trace MR and TR. He was started on ASA and Brilinta as well as atorvastatin 80mg  and Lopressor 25mg  BID. He had some brief runs of NSVT felt to be related to reperfusion. 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 He was  discharged home in good condition on 09/10/15.   Hospital Course  CAD/ inferior STEMI: S/p DES of mid RCA. DAPT for one year. EF 55-65%. Continue high dose statin and beta blocker as BP allows -- 2D ECHO 09/10/15:  Normal LV systolic function; [id number]; trace MR and TR. -- Plans to continue with cardiac rehab  HLD: LDL 89: goal <70. Continue on high dose statin.  NSVT secondary to reperfusion: No further NSVT. Continue beta blocker  Sinus tachycardia: continue BB.   Mild AKI: creat 1.31. Repeat BMET in 1 week at follow up.    The patient has had an uncomplicated hospital course and is recovering well. The radial catheter site is stable. He has been seen by Benjamin Carrillo today and deemed ready for discharge home. All follow-up appointments have been scheduled. A written RX for a 30 day free supply of Brilinta was provided for the patient. A work excuse note was provided as well. Discharge medications are listed below.   Discharge Vitals: Blood pressure 123/73, pulse 88, temperature 98 F (36.7 C), temperature source Oral, resp. rate 20, height 5\' 10"  (1.778 m), weight 232 lb 2.3 oz (105.3 kg), SpO2 96 %.  Labs: Lab Results  Component Value Date   WBC 13.4* 09/09/2015   HGB 17.1* 09/09/2015   HCT 51.1 09/09/2015   MCV 90.6 09/09/2015   PLT 200 09/09/2015     Recent Labs Lab 09/10/15 0435  NA 140  K 4.4  CL 107  CO2 24  BUN  9  CREATININE 1.31*  CALCIUM 9.2  PROT 5.6*  BILITOT 0.7  ALKPHOS 51  ALT 60  AST 59*  GLUCOSE 86    Recent Labs  09/08/15 0725 09/08/15 1120 09/08/15 1520 09/08/15 2017  TROPONINI 0.16* 5.03* 10.01* 16.24*   Lab Results  Component Value Date   CHOL 141 09/09/2015   HDL 27* 09/09/2015   LDLCALC 89 09/09/2015   TRIG 126 09/09/2015   No results found for: DDIMER  Diagnostic Studies/Procedures   Portable Chest X-ray 1 View  09/08/2015  CLINICAL DATA:  44 year old male status post ST elevation myocardial infarction EXAM: PORTABLE CHEST  1 VIEW COMPARISON:  None. FINDINGS: The lungs are clear and negative for focal airspace consolidation, pulmonary edema or suspicious pulmonary nodule. No pleural effusion or pneumothorax. Cardiac and mediastinal contours are within normal limits. No acute fracture or lytic or blastic osseous lesions. The visualized upper abdominal bowel gas pattern is unremarkable. IMPRESSION: No active cardiopulmonary disease. Electronically Signed   By: Jacqulynn Cadet M.D.   On: 09/08/2015 10:23   [facility] 09/10/15 Procedures    Coronary Stent Intervention   Left Heart Cath and Coronary Angiography    Conclusion     The left ventricular systolic function is normal.  Mid RCA lesion, 100% stenosed. Post intervention, there is a 0% residual stenosis.  1. Single vessel occlusive CAD 2. Good LV function 3. Successful stenting of the mid RCA with a DES.  Plan: DAPT for one year. If no complications he is a candidate for fast track discharge.     2D ECHO: 09/10/2015 LV EF: 55% -  60% Study Conclusions - Left ventricle: The cavity size was normal. Wall thickness was increased in a pattern of mild LVH. Systolic function was normal. The estimated ejection fraction was in the range of 55% to 60%. Wall motion was normal; there were no regional wall motion abnormalities. Doppler parameters are consistent with abnormal left ventricular relaxation (grade 1 diastolic dysfunction). Impressions: - Normal LV systolic function; grade 1 diastolic dysfunction; trace MR and TR.   Discharge Medications     Medication List    STOP taking these medications        GOODY BODY PAIN 500-325 MG Pack  Generic drug:  Aspirin-Acetaminophen      TAKE these medications        ALPRAZolam 0.5 MG tablet  Commonly known as:  XANAX  Take 0.5 mg by mouth daily as needed for anxiety.     aspirin 81 MG chewable tablet  Chew 1 tablet (81 mg total) by mouth daily.     atorvastatin 80 MG tablet  Commonly  known as:  LIPITOR  Take 1 tablet (80 mg total) by mouth daily at 6 PM.     calcium carbonate 500 MG chewable tablet  Commonly known as:  TUMS - dosed in mg elemental calcium  Chew 1 tablet by mouth daily as needed for indigestion or heartburn.     metoprolol tartrate 25 MG tablet  Commonly known as:  LOPRESSOR  Take 1 tablet (25 mg total) by mouth 2 (two) times daily.     oxyCODONE 5 MG immediate release tablet  Commonly known as:  Oxy IR/ROXICODONE  Take 5 mg by mouth daily as needed. for pain     ticagrelor 90 MG Tabs tablet  Commonly known as:  BRILINTA  Take 1 tablet (90 mg total) by mouth 2 (two) times daily.        Disposition   The  patient will be discharged in stable condition to home. Discharge Instructions    AMB Referral to Cardiac Rehabilitation - Phase II    Complete by:  As directed   Diagnosis:  Myocardial Infarction     Amb Referral to Cardiac Rehabilitation    Complete by:  As directed   Diagnosis:   Myocardial Infarction PCI            Follow-up Information    Follow up with Eileen Stanford, PA-C On 09/19/2015.   Specialties:  Cardiology, Radiology   Why:  @ 9:30am   Contact information:   [place] [address] [phone]         Duration of Discharge Encounter: Greater than 30 minutes including physician and PA time.  Signed, Angelena Form R PA-C 09/10/2015, 1:22 PM   Personally seen and examined. Agree with above. 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

## 2015-09-10 NOTE — Progress Notes (Signed)
Patient discharge home with wife. Discharge instructions discussed with patient and wife who verbalized understanding. Transported out of unit via wheelchair.

## 2015-09-10 NOTE — Progress Notes (Signed)
*  PRELIMINARY RESULTS* Echocardiogram 2D Echocardiogram has been performed.  Benjamin Carrillo 09/10/2015, 9:53 AM

## 2015-09-10 NOTE — Progress Notes (Signed)
CARDIAC REHAB PHASE I   PRE:  Rate/Rhythm: 98 SR  BP:  Supine: 133/74  Sitting:  Standing:    SaO2: 93%RA  MODE:  Ambulation: 550 ft   POST:  Rate/Rhythm: 110 ST  BP:  Supine:   Sitting: 128/79  Standing:    SaO2: 96%RA 0820-0905, 0950-1040 Pt requesting to be seen early so he can go home later. Walked with pt 550 ft with steady gait and no CP. 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 Left to give privacy and returned to ed.  Reviewed purpose of brilinta with stent and pt has brilinta card. Reviewed NTG use, heart healthy diet, risk factors, ex ed and CRP 2. Referring to [facility] CRP 2. Pt and wife very receptive to ed and want to do all they can to adhere.    Graylon Good, RN BSN  09/10/2015 10:37 AM

## 2015-09-10 NOTE — Progress Notes (Signed)
Patient Name: Benjamin Carrillo Date of Encounter: 09/10/2015     Active Problems:   ST elevation (STEMI) myocardial infarction involving other coronary artery of inferior wall (HCC)   Elevated blood pressure   Acute ST elevation myocardial infarction (STEMI) (HCC)   ST elevation (STEMI) myocardial infarction involving right coronary artery ([place])    SUBJECTIVE  No CP or SOB. About to walk with cardiac rehab.   CURRENT MEDS . aspirin  81 mg Oral Daily  . atorvastatin  80 mg Oral q1800  . heparin  5,000 Units Subcutaneous 3 times per day  . metoprolol tartrate  25 mg Oral BID  . sodium chloride  3 mL Intravenous Q12H  . ticagrelor  90 mg Oral BID    OBJECTIVE  Filed Vitals:   09/09/15 1126 09/09/15 2200 09/10/15 0554 09/10/15 0605  BP: 120/70 120/63  106/68  Pulse: 84 79  78  Temp: 98 F (36.7 C) 98.2 F (36.8 C)  98.4 F (36.9 C)  TempSrc: Oral Oral  Oral  Resp: 18 18  18   Height: 5\' 10"  (1.778 m)     Weight: 236 lb 8.9 oz (107.3 kg)  232 lb 2.3 oz (105.3 kg)   SpO2: 100% 99%  95%    Intake/Output Summary (Last 24 hours) at 09/10/15 0819 Last data filed at 09/09/15 1840  Gross per 24 hour  Intake    475 ml  Output      0 ml  Net    475 ml   Filed Weights   09/09/15 0500 09/09/15 1126 09/10/15 0554  Weight: 236 lb 8.9 oz (107.3 kg) 236 lb 8.9 oz (107.3 kg) 232 lb 2.3 oz (105.3 kg)    PHYSICAL EXAM  General: Pleasant, NAD. Neuro: Alert and oriented X 3. Moves all extremities spontaneously. Psych: Normal affect. HEENT:  Normal  Neck: Supple without bruits or JVD. Lungs:  Resp regular and unlabored, CTA. Heart: RRR no s3, s4, or murmurs. Abdomen: Soft, non-tender, non-distended, BS + x 4.  Extremities: No clubbing, cyanosis or edema. DP/PT/Radials 2+ and equal bilaterally.  Accessory Clinical Findings  CBC  Recent Labs  09/08/15 1120 09/09/15 0423  WBC 18.2* 13.4*  HGB 17.8* 17.1*  HCT 52.3* 51.1  MCV 90.2 90.6  PLT 203 [id number]   Basic Metabolic  Panel  Recent Labs  09/08/15 1520 09/09/15 0423 09/10/15 0435  NA  --  139 140  K  --  4.3 4.4  CL  --  109 107  CO2  --  22 24  GLUCOSE  --  122* 86  BUN  --  8 9  CREATININE  --  1.20 1.31*  CALCIUM  --  8.7* 9.2  MG 1.8  --   --    Liver Function Tests  Recent Labs  09/09/15 0423 09/10/15 0435  AST 120* 59*  ALT 66* 60  ALKPHOS 48 51  BILITOT 1.3* 0.7  PROT 5.2* 5.6*  ALBUMIN 3.1* 3.2*   No results for input(s): LIPASE, AMYLASE in the last 72 hours. Cardiac Enzymes  Recent Labs  09/08/15 1120 09/08/15 1520 09/08/15 2017  TROPONINI 5.03* 10.01* 16.24*   BNP Invalid input(s): POCBNP D-Dimer No results for input(s): DDIMER in the last 72 hours. Hemoglobin A1C  Recent Labs  09/08/15 1120  HGBA1C 5.6   Fasting Lipid Panel  Recent Labs  09/09/15 0423  CHOL 141  HDL 27*  LDLCALC 89  TRIG 126  CHOLHDL 5.2   Thyroid Function Tests  Recent Labs  09/08/15 1120  TSH 1.032    TELE  Sinus tachy with  Few PVCs  Radiology/Studies  Portable Chest X-ray 1 View  09/08/2015  CLINICAL DATA:  44 year old male status post ST elevation myocardial infarction EXAM: PORTABLE CHEST 1 VIEW COMPARISON:  None. FINDINGS: The lungs are clear and negative for focal airspace consolidation, pulmonary edema or suspicious pulmonary nodule. No pleural effusion or pneumothorax. Cardiac and mediastinal contours are within normal limits. No acute fracture or lytic or blastic osseous lesions. The visualized upper abdominal bowel gas pattern is unremarkable. IMPRESSION: No active cardiopulmonary disease. Electronically Signed   By: Jacqulynn Cadet M.D.   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  CAD/ inferior STEMI: S/p DES of mid RCA. DAPT for one year. EF 55-65%. Continue high dose statin  and beta blocker as BP allows -- 2D ECHO pending.  -- Plans to continue with cardiac rehab   Dyslipidemia: on high dose statin.  NSVT secondary to reperfusion: No further NSVT.  Continue beta blocker  Sinus tachycardia: continue BB.   Mild AKI: creat 1.31. Repeat BMET in 1 week at follow up.   Dispo: likely home today after echocardiogram. 1 week TOC appt. Wants to follow with Dr. Martinique.  Dr. Marlou Porch to see.    Judy Pimple PA-C  Pager [phone]  Personally seen and examined. Agree with above. 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

## 2015-09-12 LAB — TROPONIN I: Troponin I: 5.03 ng/mL (ref <0.031)

## 2015-09-17 ENCOUNTER — Telehealth: Payer: Self-pay | Admitting: Cardiology

## 2015-09-17 NOTE — Telephone Encounter (Signed)
Benjamin Carrillo was calling in to inform Dr.Jordan that the pt has been enrolled into their case management program for his recent MI that he has had. She says there was no need for a call back  Thanks

## 2015-09-18 NOTE — Progress Notes (Signed)
Cardiology Office Note   Date:  09/19/2015   ID:  Benjamin Carrillo, DOB [birth year], MRN [id number]  PCP:  Everlean Alstrom, MD  Cardiologist:  Dr. Martinique  Post hospital follow up- CAD    History of Present Illness: Benjamin Carrillo is a 45 y.o. male with a history of HLD and recently diagnosed CAD: inferior STEMI s/p DES of mid RCA who presents to clinic for post hospital follow up.  He was recently admitted to [facility] 09/08/15-09/10/15. The morning of admission he awoke and felt nauseated. He ate a peanut butter and jelly sandwich then began developing substernal chest pain and jaw pain. He took 2 Goody powders (each containing 325mg  ASA). When pain did not resolve, he called 911. Initial BPs were 135/88->150/100->142/86. EKG in the field showed ST elevation in leads III, avF with reciprocal ST depression in I, avL. Code STEMI was called and he was brought to the [facility] lab emergently. He received 2 SL NTG sprays and 172mcg of fentanyl without significant relief of pain. He denied any history of tobacco or drug use. + Family history of CAD in both grandfathers. Rare EtOH. He does report drinking a lot of energy drinks and using thermal cut products for weightlifting. He was taken back for emergent cardiac catheterization on 09/08/15 and found to have single vessel occlusive CAD in RCA s/p successful stenting of the mid RCA with a DES. He had good LV function. 2D ECHO 09/10/15: Normal LV systolic function; [id number]; trace MR and TR. He was started on ASA and Brilinta as well as atorvastatin 80mg  and Lopressor 25mg  BID. He had some brief runs of NSVT felt to be related to reperfusion. 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 He was discharged home in good condition on 09/10/15.  Today he presents to clinic for follow up. He has a little bit of chest pain but he isn't sure if this is just "in his head." He has not had to take any nitroglycerin. No  blood in stool or urine. No SOB. He did have some mild SOB with Brilinta which has now resolved. No LE edema, orthopnea or PND. He has some joint and shoulder pain that keeps him from lying flat. No dizziness or syncope. 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 He doesn't know if he will want to be under such physical/emotional stress in the long term. Planning to go back to light duty at the end of the month.    Past Medical History  Diagnosis Date  . Pinched nerve     L4  . CAD (coronary artery disease)     a. 09/08/15: inferior STEMI s/p DES of mid RCA  . HLD (hyperlipidemia)     Past Surgical History  Procedure Laterality Date  . Inguinal hernia repair    . Cardiac catheterization N/A 09/08/2015    Procedure: Left Heart Cath and Coronary Angiography;  Surgeon: Peter M Martinique, MD;  Location: [place] [facility];  Service: Cardiovascular;  Laterality: N/A;  . Cardiac catheterization N/A 09/08/2015    Procedure: Coronary Stent Intervention;  Surgeon: Peter M Martinique, MD;  Location: [place] [place] [facility];  Service: Cardiovascular;  Laterality: N/A;  mid rca      Current Outpatient Prescriptions  Medication Sig Dispense Refill  . ALPRAZolam (XANAX) 0.5 MG tablet Take 0.5 mg by mouth daily as needed for anxiety.    Marland Kitchen  aspirin 81 MG chewable tablet Chew 1 tablet (81 mg total) by mouth daily.    Marland Kitchen atorvastatin (LIPITOR) 80 MG tablet Take 1 tablet (80 mg total) by mouth daily at 6 PM. 30 tablet 6  . calcium carbonate (TUMS - DOSED IN MG ELEMENTAL CALCIUM) 500 MG chewable tablet Chew 1 tablet by mouth daily as needed for indigestion or heartburn.    . metoprolol tartrate (LOPRESSOR) 25 MG tablet Take 1 tablet (25 mg total) by mouth 2 (two) times daily. 60 tablet 6  . nitroGLYCERIN (NITROSTAT) 0.4 MG SL tablet Place 1 tablet (0.4 mg total) under the tongue every 5 (five) minutes as needed for chest pain (up to 3 doses). 25 tablet 3  . oxyCODONE (OXY IR/ROXICODONE) 5 MG  immediate release tablet Take 5 mg by mouth daily as needed. for pain  0  . ticagrelor (BRILINTA) 90 MG TABS tablet Take 1 tablet (90 mg total) by mouth 2 (two) times daily. 60 tablet 11   No current facility-administered medications for this visit.    Allergies:   Penicillins and Erythromycin    Social History:  The patient  reports that he has never smoked. He does not have any smokeless tobacco history on file. He reports that he drinks alcohol. He reports that he does not use illicit drugs.   Family History:  The patient's family history includes CAD in his maternal grandfather and paternal grandfather.    ROS:  Please see the history of present illness.   Otherwise, review of systems are positive for NONE.   All other systems are reviewed and negative.    PHYSICAL EXAM: VS:  BP 110/80 mmHg  Pulse 71  Ht 5\' 10"  (1.778 m)  Wt 234 lb 6.4 oz (106.323 kg)  BMI 33.63 kg/m2 , BMI Body mass index is 33.63 kg/(m^2). GEN: Well nourished, well developed, in no acute distress HEENT: normal Neck: no JVD, carotid bruits, or masses Cardiac: RRR; no murmurs, rubs, or gallops,no edema  Respiratory:  clear to auscultation bilaterally, normal work of breathing GI: soft, nontender, nondistended, + BS MS: no deformity or atrophy Skin: warm and dry, no rash Neuro:  Strength and sensation are intact Psych: euthymic mood, full affect   EKG:  EKG is ordered today. The ekg ordered today demonstrates HR 71. Old inferior infarct   Recent Labs: 09/08/2015: Magnesium 1.8; TSH 1.032 09/09/2015: Hemoglobin 17.1*; Platelets 200 09/10/2015: ALT 60; BUN 9; Creatinine, Ser 1.31*; Potassium 4.4; Sodium 140    Lipid Panel    Component Value Date/Time   CHOL 141 09/09/2015 0423   TRIG 126 09/09/2015 0423   HDL 27* 09/09/2015 0423   CHOLHDL 5.2 09/09/2015 0423   VLDL 25 09/09/2015 0423   LDLCALC 89 09/09/2015 0423      Wt Readings from Last 3 Encounters:  09/19/15 234 lb 6.4 oz (106.323 kg)    09/10/15 232 lb 2.3 oz (105.3 kg)      Other studies Reviewed: Additional studies/ records that were reviewed today include: LHC, 2D ECHO Review of the above records demonstrates:    2D ECHO: 09/10/2015 LV EF: 55% -  60% Study Conclusions - Left ventricle: The cavity size was normal. Wall thickness was increased in a pattern of mild LVH. Systolic function was normal. The estimated ejection fraction was in the range of 55% to 60%. Wall motion was normal; there were no regional wall motion abnormalities. Doppler parameters are consistent with abnormal left ventricular relaxation (grade 1 diastolic dysfunction). Impressions: -  Normal LV systolic function; grade 1 diastolic dysfunction; trace MR and TR.    ASSESSMENT AND PLAN:  Benjamin Carrillo is a 45 y.o. male with a history of HLD and recently diagnosed CAD: inferior STEMI s/p DES of mid RCA who presents to clinic for post hospital follow up.  CAD/ inferior STEMI: S/p DES of mid RCA. DAPT for one year. EF 55-65%. Continue high dose statin and beta blocker as BP allows -- 2D ECHO 09/10/15: Normal LV systolic function; [id number]; trace MR and TR. -- Plans to continue with cardiac rehab  HLD: LDL 89: goal <70. Continue on high dose statin.  Mild AKI: creat 1.31. Repeat BMET today.  Dispo: he is IT trainer and doesn't know if he will be able to go back to work long term as it is very physically and emotionally demanding. He says that most the guys who have had MIs are not able to return to work on full duty. I told him we are happy to support his decision either way. FMLA forms are being filled out today for he and his wife. He will go back on light duty at the end of the month. He may need a stress test prior to return. He will find out.   Current medicines are reviewed at length with the patient today.  The patient does not have concerns regarding medicines.  The following changes have been made:  no change  Labs/  tests ordered today include:   Orders Placed This Encounter  Procedures  . Basic Metabolic Panel (BMET)  . EKG 12-Lead    Disposition:   FU with  Dr Martinique in 6-8 weeks   Signed, Crista Luria  09/19/2015 10:37 AM    [address], [place], [place]  [address] Phone: [phone]; Fax: [phone]

## 2015-09-19 ENCOUNTER — Telehealth: Payer: Self-pay | Admitting: *Deleted

## 2015-09-19 ENCOUNTER — Encounter: Payer: Self-pay | Admitting: Physician Assistant

## 2015-09-19 ENCOUNTER — Ambulatory Visit (INDEPENDENT_AMBULATORY_CARE_PROVIDER_SITE_OTHER): Payer: BLUE CROSS/BLUE SHIELD | Admitting: Physician Assistant

## 2015-09-19 VITALS — BP 110/80 | HR 71 | Ht 70.0 in | Wt 234.4 lb

## 2015-09-19 DIAGNOSIS — Z955 Presence of coronary angioplasty implant and graft: Secondary | ICD-10-CM

## 2015-09-19 DIAGNOSIS — I2119 ST elevation (STEMI) myocardial infarction involving other coronary artery of inferior wall: Secondary | ICD-10-CM | POA: Diagnosis not present

## 2015-09-19 DIAGNOSIS — I214 Non-ST elevation (NSTEMI) myocardial infarction: Secondary | ICD-10-CM

## 2015-09-19 LAB — BASIC METABOLIC PANEL
BUN: 15 mg/dL (ref 7–25)
CO2: 26 mmol/L (ref 20–31)
Calcium: 9.1 mg/dL (ref 8.6–10.3)
Chloride: 105 mmol/L (ref 98–110)
Creat: 1.45 mg/dL — ABNORMAL HIGH (ref 0.60–1.35)
Glucose, Bld: 86 mg/dL (ref 65–99)
POTASSIUM: 4.6 mmol/L (ref 3.5–5.3)
SODIUM: 138 mmol/L (ref 135–146)

## 2015-09-19 NOTE — Telephone Encounter (Signed)
-----   Message from Eileen Stanford, PA-C sent at 09/19/2015  4:21 PM EST ----- Creat mildly elevated. Lets have him come back in a week for a repeat BMET to make sure it is trending down. Thanks!

## 2015-09-19 NOTE — Telephone Encounter (Signed)
Pt aware of his lab results and he will go to the [place] office to repeat in 1 week.  Order in [place].

## 2015-09-19 NOTE — Patient Instructions (Addendum)
Medication Instructions:  Your physician recommends that you continue on your current medications as directed. Please refer to the Current Medication list given to you today.   Labwork: TODAY:  BMET  Testing/Procedures: None ordered  Follow-Up: Your physician recommends that you schedule a follow-up appointment in: 6-8 WEEKS WITH DR. Martinique   Any Other Special Instructions Will Be Listed Below (If Applicable).   If you need a refill on your cardiac medications before your next appointment, please call your pharmacy.

## 2015-09-24 ENCOUNTER — Other Ambulatory Visit (INDEPENDENT_AMBULATORY_CARE_PROVIDER_SITE_OTHER): Payer: BLUE CROSS/BLUE SHIELD | Admitting: *Deleted

## 2015-09-24 ENCOUNTER — Telehealth: Payer: Self-pay | Admitting: *Deleted

## 2015-09-24 DIAGNOSIS — I214 Non-ST elevation (NSTEMI) myocardial infarction: Secondary | ICD-10-CM

## 2015-09-24 NOTE — Telephone Encounter (Signed)
Called pt, per Nell Range, PA-C, and advised him that his creatinine was elevated from the latest lab work and that I have made him an appt at the [place] office to have his labs repeated.  Pt is aware of appt 09/26/15 @ 9:00 in the [place] office.  Pt verbalized understanding.

## 2015-09-24 NOTE — Telephone Encounter (Signed)
-----   Message from Eileen Stanford, PA-C sent at 09/19/2015  4:21 PM EST ----- Creat mildly elevated. Lets have him come back in a week for a repeat BMET to make sure it is trending down. Thanks!

## 2015-09-25 ENCOUNTER — Encounter: Payer: Self-pay | Admitting: *Deleted

## 2015-09-25 ENCOUNTER — Encounter: Payer: BLUE CROSS/BLUE SHIELD | Attending: Cardiology | Admitting: *Deleted

## 2015-09-25 VITALS — Ht 70.5 in | Wt 234.5 lb

## 2015-09-25 DIAGNOSIS — I2111 ST elevation (STEMI) myocardial infarction involving right coronary artery: Secondary | ICD-10-CM

## 2015-09-25 DIAGNOSIS — Z9861 Coronary angioplasty status: Secondary | ICD-10-CM | POA: Insufficient documentation

## 2015-09-25 NOTE — Progress Notes (Signed)
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  ALPRAZolam (XANAX) 0.5 MG tablet, Take 0.5 mg by mouth daily as needed for anxiety., Disp: , Rfl:  .  aspirin 81 MG chewable tablet, Chew 1 tablet (81 mg total) by mouth daily., Disp: , Rfl:  .  atorvastatin (LIPITOR) 80 MG tablet, Take 1 tablet (80 mg total) by mouth daily at 6 PM., Disp: 30 tablet, Rfl: 6 .  calcium carbonate (TUMS - DOSED IN MG ELEMENTAL CALCIUM) 500 MG chewable tablet, Chew 1 tablet by mouth daily as needed for indigestion or heartburn., Disp: , Rfl:  .  metoprolol tartrate (LOPRESSOR) 25 MG tablet, Take 1 tablet (25 mg total) by mouth 2 (two) times daily., Disp: 60 tablet, Rfl: 6 .  nitroGLYCERIN (NITROSTAT) 0.4 MG SL tablet, Place 1 tablet (0.4 mg total) under the tongue every 5 (five) minutes as needed for chest pain (up to 3 doses)., Disp: 25 tablet, Rfl: 3 .  oxyCODONE (OXY IR/ROXICODONE) 5 MG immediate release tablet, Take 5 mg by mouth daily as needed. for pain, Disp: , Rfl: 0 .  ticagrelor (BRILINTA) 90 MG TABS tablet, Take 1 tablet (90 mg total) by mouth 2 (two) times daily., Disp: 60 tablet, Rfl: 11  Past Medical History: Past Medical History  Diagnosis Date  . Pinched nerve     L4  . CAD (coronary artery disease)     a. 09/08/15: inferior STEMI s/p DES of mid RCA  . HLD (hyperlipidemia)     Tobacco Use: History  Smoking status  . Never Smoker   Smokeless tobacco  . Not on file    Labs: Recent Review Flowsheet Data    Labs for ITP Cardiac and Pulmonary Rehab Latest Ref Rng  09/08/2015 09/09/2015   Cholestrol 0 - 200 mg/dL - 141   LDLCALC 0 - 99 mg/dL - 89   HDL >40 mg/dL - 27(L)   Trlycerides <150 mg/dL - 126   Hemoglobin A1c 4.8 - 5.6 % 5.6 -   TCO2 0 - 100 mmol/L 23 -       Exercise Target Goals: Date: 09/25/15  Exercise Program Goal: Individual exercise prescription set with THRR, safety & activity barriers. Participant demonstrates ability to understand and report RPE using BORG scale, to self-measure pulse accurately, and to acknowledge the importance of the exercise prescription.  Exercise Prescription Goal: Starting with aerobic activity 30 plus minutes a day, 3 days per week for initial exercise prescription. Provide home exercise prescription and guidelines that participant acknowledges understanding prior to discharge.  Activity Barriers & Risk Stratification:     Activity Barriers & Risk Stratification - 09/25/15 1539    Activity Barriers & Risk Stratification   Activity Barriers Back Problems   Risk Stratification High      6 Minute Walk:     6 Minute Walk      09/25/15 1528       6 Minute Walk   Phase Initial     Distance 1330 feet     Walk Time 6 minutes     Resting HR 78 bpm     Resting BP 122/86 mmHg  Max Ex. HR 116 bpm     Max Ex. BP 144/84 mmHg     RPE 11     Symptoms Yes (comment)     Comments Some chest tightness        Initial Exercise Prescription:     Initial Exercise Prescription - 09/25/15 1500    Date of Initial Exercise Prescription   Date 09/25/15   Treadmill   MPH 2.5   Grade 0   Minutes 15   Bike   Level 0.4   Watts 15   Minutes 15   Recumbant Bike   Level 5   RPM 45   Watts 50   Minutes 15   NuStep   Level 4   Watts 50   Minutes 15   Arm Ergometer   Level 2   Watts 12   Minutes 10   Arm/Foot Ergometer   Level 4   Watts 15   Minutes 10   Cybex   Level 3   RPM 60   Minutes 15   Recumbant Elliptical   Level 2   RPM 45   Watts 25   Minutes 15   Elliptical   Level 1    Speed 3   Minutes 1   REL-XR   Level 3   Watts 45   Minutes 15   T5 Nustep   Level 3   Watts 25   Minutes 15   Biostep-RELP   Level 3   Watts 30   Minutes 15   Prescription Details   Frequency (times per week) 3   Duration Progress to 30 minutes of continuous aerobic without signs/symptoms of physical distress   Intensity   THRR REST +  30   Ratings of Perceived Exertion 11-15   Progression Continue progressive overload as per policy without signs/symptoms or physical distress.   Resistance Training   Training Prescription Yes   Weight 3   Reps 10-15      Exercise Prescription Changes:   Discharge Exercise Prescription (Final Exercise Prescription Changes):   Nutrition:  Target Goals: Understanding of nutrition guidelines, daily intake of sodium 1500mg , cholesterol 200mg , calories 30% from fat and 7% or less from saturated fats, daily to have 5 or more servings of fruits and vegetables.  Biometrics:     Pre Biometrics - 09/25/15 1527    Pre Biometrics   Height 5' 10.5" (1.791 m)   Weight 234 lb 8 oz (106.369 kg)   Waist Circumference 39 inches   Hip Circumference 41 inches   Waist to Hip Ratio 0.95 %   BMI (Calculated) 33.2       Nutrition Therapy Plan and Nutrition Goals:   Nutrition Discharge: Rate Your Plate Scores:     Rate Your Plate - [id number]    Rate Your Plate Scores   Pre Score --  Patient will bring completed Rate Your Plate Questionnaire when he starts on January 16th.        Nutrition Goals Re-Evaluation:   Psychosocial: Target Goals: Acknowledge presence or absence of depression, maximize coping skills, provide positive support system. Participant is able to verbalize types and ability to use techniques and skills needed for reducing stress and depression.  Initial Review & Psychosocial Screening:     Initial Psych Review & Screening - 09/25/15 1827    Initial Review   Current issues with Current Depression;Current  Stress Concerns   Source of Stress Concerns Occupation   Comments Scored 13 on PHQ-9.  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  Sometimes patient feels he is depressed .  Patient takes Xanax prn for anxiety.     Family Dynamics   Good Support System? Yes  Patient has one wife and 3 children.  Is a member of a large [employer], [place].     Barriers   Psychosocial barriers to participate in program The patient should benefit from training in stress management and relaxation.   Screening Interventions   Interventions Encouraged to exercise;Program counselor consult      Quality of Life Scores:     Quality of Life - 09/25/15 1834    Quality of Life Scores   Health/Function Pre --  Patient will bring completed questionnaire when he starts Cardiac Rehab on January 16th.        PHQ-9:     Recent Review Flowsheet Data    Depression screen [facility] 2/9 09/25/2015   Decreased Interest 1   Down, Depressed, Hopeless 1   PHQ - 2 Score 2   Altered sleeping 3   Tired, decreased energy 3   Change in appetite 3   Feeling bad or failure about yourself  1   Trouble concentrating 0   Moving slowly or fidgety/restless 1   Suicidal thoughts 0   PHQ-9 Score 13   Difficult doing work/chores Somewhat difficult      Psychosocial Evaluation and Intervention:   Psychosocial Re-Evaluation:   Vocational Rehabilitation: Provide vocational rehab assistance to qualifying candidates.   Vocational Rehab Evaluation & Intervention:     Vocational Rehab - 09/25/15 1540    Initial Vocational Rehab Evaluation & Intervention   Assessment shows need for Vocational Rehabilitation No      Education: Education Goals: Education classes will be provided on a weekly basis, covering  required topics. Participant will state understanding/return demonstration of topics presented.  Learning Barriers/Preferences:     Learning Barriers/Preferences - 09/25/15 1539    Learning Barriers/Preferences   Learning Barriers None   Learning Preferences Written Material;Group Instruction;Video      Education Topics: General Nutrition Guidelines/Fats and Fiber: -Group instruction provided by verbal, written material, models and posters to present the general guidelines for heart healthy nutrition. Gives an explanation and review of dietary fats and fiber.   Controlling Sodium/Reading Food Labels: -Group verbal and written material supporting the discussion of sodium use in heart healthy nutrition. Review and explanation with models, verbal and written materials for utilization of the food label.   Exercise Physiology & Risk Factors: - Group verbal and written instruction with models to review the exercise physiology of the cardiovascular system and associated critical values. Details cardiovascular disease risk factors and the goals associated with each risk factor.   Aerobic Exercise & Resistance Training: - Gives group verbal and written discussion on the health impact of inactivity. On the components of aerobic and resistive training programs and the benefits of this training and how to safely progress through these programs.   Flexibility, Balance, General Exercise Guidelines: - Provides group verbal and written instruction on the benefits of flexibility and balance training programs. Provides general exercise guidelines with specific guidelines to those with heart or lung disease. Demonstration and skill practice provided.  Stress Management: - Provides group verbal and written instruction about the health risks of elevated stress, cause of high stress, and healthy ways to reduce stress.   Depression: - Provides group verbal and written instruction on the correlation  between heart/lung disease and depressed mood, treatment options, and the stigmas associated with seeking treatment.   Anatomy & Physiology of the Heart: - Group verbal and written instruction and models provide basic cardiac anatomy and physiology, with the coronary electrical and arterial systems. Review of: AMI, Angina, Valve disease, Heart Failure, Cardiac Arrhythmia, Pacemakers, and the ICD.   Cardiac Procedures: - Group verbal and written instruction and models to describe the testing methods done to diagnose heart disease. Reviews the outcomes of the test results. Describes the treatment choices: Medical Management, Angioplasty, or Coronary Bypass Surgery.   Cardiac Medications: - Group verbal and written instruction to review commonly prescribed medications for heart disease. Reviews the medication, class of the drug, and side effects. Includes the steps to properly store meds and maintain the prescription regimen.   Go Sex-Intimacy & Heart Disease, Get SMART - Goal Setting: - Group verbal and written instruction through game format to discuss heart disease and the return to sexual intimacy. Provides group verbal and written material to discuss and apply goal setting through the application of the S.M.A.R.T. Method.   Other Matters of the Heart: - Provides group verbal, written materials and models to describe Heart Failure, Angina, Valve Disease, and Diabetes in the realm of heart disease. Includes description of the disease process and treatment options available to the cardiac patient.   Exercise & Equipment Safety: - Individual verbal instruction and demonstration of equipment use and safety with use of the equipment.          Cardiac Rehab from 09/25/2015 in [facility] Cardiac and Pulmonary Rehab   Date  09/25/15   Educator  DW   Instruction Review Code  1- partially meets, needs review/practice      Infection Prevention: - Provides verbal and written material to individual  with discussion of infection control including proper hand washing and proper equipment cleaning during exercise session.      Cardiac Rehab from 09/25/2015 in [facility] Cardiac and Pulmonary Rehab   Date  09/25/15   Educator  DW   Instruction Review Code  2- meets goals/outcomes      Falls Prevention: - Provides verbal and written material to individual with discussion of falls prevention and safety.      Cardiac Rehab from 09/25/2015 in [facility] Cardiac and Pulmonary Rehab   Date  09/25/15   Educator  DW   Instruction Review Code  2- meets goals/outcomes      Diabetes: - Individual verbal and written instruction to review signs/symptoms of diabetes, desired ranges of glucose level fasting, after meals and with exercise. Advice that pre and post exercise glucose checks will be done for 3 sessions at entry of program.    Knowledge Questionnaire Score:     Knowledge Questionnaire Score - 09/25/15 1540    Knowledge Questionnaire Score   Pre Score --  Will bring completed questionnaire on Monday, January 16th.        Personal Goals and Risk Factors at Admission:     Personal Goals and Risk Factors at Admission - 09/25/15 1823    Personal Goals and Risk Factors on Admission    Weight Management Yes   Intervention Learn and follow the exercise and diet guidelines while in the program. Utilize the nutrition  and education classes to help gain knowledge of the diet and exercise expectations in the program   Admit Weight 234 lb 6.4 oz (106.323 kg)   Goal Weight 210 lb (95.255 kg)   Increase Aerobic Exercise and Physical Activity Yes   Intervention While in program, learn and follow the exercise prescription taught. Start at a low level workload and increase workload after able to maintain previous level for 30 minutes. Increase time before increasing intensity.   Understand more about Heart/Pulmonary Disease. Yes   Intervention While in program utilize professionals for any questions, and  attend the education sessions. Great websites to use are www.americanheart.org or www.lung.org for reliable information.   Diabetes No   Hypertension Yes   Goal Participant will see blood pressure controlled within the values of 140/41mm/Hg or within value directed by their physician.   Intervention Provide nutrition & aerobic exercise along with prescribed medications to achieve BP 140/90 or less.   Lipids Yes   Goal Cholesterol controlled with medications as prescribed, with individualized exercise RX and with personalized nutrition plan. Value goals: LDL < 70mg , HDL > 40mg . Participant states understanding of desired cholesterol values and following prescriptions.   Intervention Provide nutrition & aerobic exercise along with prescribed medications to achieve LDL 70mg , HDL >40mg .   Stress Yes   Goal To meet with psychosocial counselor for stress and relaxation information and guidance. To state understanding of performing relaxation techniques and or identifying personal stressors.   Intervention Provide education on types of stress, identifiying stressors, and ways to cope with stress. Provide demonstration and active practice of relaxation techniques.      Personal Goals and Risk Factors Review:    Personal Goals Discharge (Final Personal Goals and Risk Factors Review):    ITP Comments:   Comments: Patient to start Cardiac Rehab on Monday, September 30, 2015.

## 2015-09-25 NOTE — Patient Instructions (Signed)
Patient Instructions  Patient Details  Name: Benjamin Carrillo MRN: [id number] Date of Birth: 10/20/1970 Referring Provider:  Martinique, Peter M, MD  Below are the personal goals you chose as well as exercise and nutrition goals. Our goal is to help you keep on track towards obtaining and maintaining your goals. We will be discussing your progress on these goals with you throughout the program.  Initial Exercise Prescription:     Initial Exercise Prescription - 09/25/15 1500    Date of Initial Exercise Prescription   Date 09/25/15   Treadmill   MPH 2.5   Grade 0   Minutes 15   Bike   Level 0.4   Watts 15   Minutes 15   Recumbant Bike   Level 5   RPM 45   Watts 50   Minutes 15   NuStep   Level 4   Watts 50   Minutes 15   Arm Ergometer   Level 2   Watts 12   Minutes 10   Arm/Foot Ergometer   Level 4   Watts 15   Minutes 10   Cybex   Level 3   RPM 60   Minutes 15   Recumbant Elliptical   Level 2   RPM 45   Watts 25   Minutes 15   Elliptical   Level 1   Speed 3   Minutes 1   REL-XR   Level 3   Watts 45   Minutes 15   T5 Nustep   Level 3   Watts 25   Minutes 15   Biostep-RELP   Level 3   Watts 30   Minutes 15   Prescription Details   Frequency (times per week) 3   Duration Progress to 30 minutes of continuous aerobic without signs/symptoms of physical distress   Intensity   THRR REST +  30   Ratings of Perceived Exertion 11-15   Progression Continue progressive overload as per policy without signs/symptoms or physical distress.   Resistance Training   Training Prescription Yes   Weight 3   Reps 10-15      Exercise Goals: Frequency: Be able to perform aerobic exercise three times per week working toward 3-5 days per week.  Intensity: Work with a perceived exertion of 11 (fairly light) - 15 (hard) as tolerated. Follow your new exercise prescription and watch for changes in prescription as you progress with the program. Changes will be reviewed with  you when they are made.  Duration: You should be able to do 30 minutes of continuous aerobic exercise in addition to a 5 minute warm-up and a 5 minute cool-down routine.  Nutrition Goals: Your personal nutrition goals will be established when you do your nutrition analysis with the dietician.  The following are nutrition guidelines to follow: Cholesterol < 200mg /day Sodium < 1500mg /day Fiber: Men under 50 yrs - 38 grams per day  Personal Goals:     Personal Goals and Risk Factors at Admission - 09/25/15 1823    Personal Goals and Risk Factors on Admission    Weight Management Yes   Intervention Learn and follow the exercise and diet guidelines while in the program. Utilize the nutrition and education classes to help gain knowledge of the diet and exercise expectations in the program   Admit Weight 234 lb 6.4 oz (106.323 kg)   Goal Weight 210 lb (95.255 kg)   Increase Aerobic Exercise and Physical Activity Yes   Intervention While in program, learn and  follow the exercise prescription taught. Start at a low level workload and increase workload after able to maintain previous level for 30 minutes. Increase time before increasing intensity.   Understand more about Heart/Pulmonary Disease. Yes   Intervention While in program utilize professionals for any questions, and attend the education sessions. Great websites to use are www.americanheart.org or www.lung.org for reliable information.   Diabetes No   Hypertension Yes   Goal Participant will see blood pressure controlled within the values of 140/34mm/Hg or within value directed by their physician.   Intervention Provide nutrition & aerobic exercise along with prescribed medications to achieve BP 140/90 or less.   Lipids Yes   Goal Cholesterol controlled with medications as prescribed, with individualized exercise RX and with personalized nutrition plan. Value goals: LDL < 70mg , HDL > 40mg . Participant states understanding of desired  cholesterol values and following prescriptions.   Intervention Provide nutrition & aerobic exercise along with prescribed medications to achieve LDL 70mg , HDL >40mg .   Stress Yes   Goal To meet with psychosocial counselor for stress and relaxation information and guidance. To state understanding of performing relaxation techniques and or identifying personal stressors.   Intervention Provide education on types of stress, identifiying stressors, and ways to cope with stress. Provide demonstration and active practice of relaxation techniques.      Tobacco Use Initial Evaluation: History  Smoking status  . Never Smoker   Smokeless tobacco  . Not on file    Copy of goals given to participant.

## 2015-09-26 ENCOUNTER — Telehealth: Payer: Self-pay | Admitting: *Deleted

## 2015-09-26 ENCOUNTER — Other Ambulatory Visit: Payer: BLUE CROSS/BLUE SHIELD

## 2015-09-26 LAB — BASIC METABOLIC PANEL
BUN/Creatinine Ratio: 9 (ref 9–20)
BUN: 12 mg/dL (ref 6–24)
CALCIUM: 9.5 mg/dL (ref 8.7–10.2)
CHLORIDE: 102 mmol/L (ref 96–106)
CO2: 18 mmol/L (ref 18–29)
Creatinine, Ser: 1.36 mg/dL — ABNORMAL HIGH (ref 0.76–1.27)
GFR calc Af Amer: 72 mL/min/{1.73_m2} (ref >59)
GFR, EST NON AFRICAN AMERICAN: 62 mL/min/{1.73_m2} (ref >59)
GLUCOSE: 57 mg/dL — AB (ref 65–99)
POTASSIUM: 4.6 mmol/L (ref 3.5–5.2)
SODIUM: 144 mmol/L (ref 134–144)

## 2015-09-26 NOTE — Telephone Encounter (Signed)
-----   Message from Eileen Stanford, PA-C sent at 09/26/2015  1:25 PM EST ----- His creat is improving. Nothing to do . Just wanted to make sure it still wasn't going up after heart cath. No further labs necessary

## 2015-09-26 NOTE — Telephone Encounter (Signed)
Per Almyra Deforest, PA-C, called pt to let him know that his creat is improving and that is what nothing to do at this time.  He was advised that we don['t need a repeat lab at this time.  Pt verbalized understanding.

## 2015-10-01 ENCOUNTER — Encounter: Payer: Self-pay | Admitting: *Deleted

## 2015-10-02 DIAGNOSIS — Z9861 Coronary angioplasty status: Secondary | ICD-10-CM

## 2015-10-02 DIAGNOSIS — I2111 ST elevation (STEMI) myocardial infarction involving right coronary artery: Secondary | ICD-10-CM

## 2015-10-02 NOTE — Progress Notes (Signed)
Daily Session Note  Patient Details  Name: Nethaniel T Thier MRN: [id number] Date of Birth: 01/06/1971 Referring Provider:  Jordan, Peter M, MD  Encounter Date: 10/02/2015  Check In:     Session Check In - 10/02/15 0915    Check-In   Staff Present Susanne Bice, RN, BSN, CCRP;Renee MacMillan, MS, ACSM CEP, Exercise Physiologist;Steven Way, BS, ACSM EP-C, Exercise Physiologist   ER physicians immediately available to respond to emergencies See telemetry face sheet for immediately available ER MD   Medication changes reported     No   Fall or balance concerns reported    No   Warm-up and Cool-down Performed on first and last piece of equipment   VAD Patient? No   Pain Assessment   Currently in Pain? No/denies         Goals Met:  Proper associated with RPD/PD & O2 Sat Exercise tolerated well No report of cardiac concerns or symptoms Strength training completed today  Goals Unmet:  Not Applicable  Goals Comments:    Dr. Mark Miller is Medical Director for [facility] and [facility]. 

## 2015-10-04 ENCOUNTER — Encounter: Payer: BLUE CROSS/BLUE SHIELD | Admitting: *Deleted

## 2015-10-04 DIAGNOSIS — I2111 ST elevation (STEMI) myocardial infarction involving right coronary artery: Secondary | ICD-10-CM | POA: Diagnosis not present

## 2015-10-04 NOTE — Progress Notes (Signed)
Daily Session Note  Patient Details  Name: Benjamin Carrillo MRN: [id number] Date of Birth: [birth year] Referring Provider:  Martinique, Peter M, MD  Encounter Date: 10/04/2015  Check In:     Session Check In - 10/04/15 0914    Check-In   Staff Present Gerlene Burdock, RN, BSN;Susanne Bice, RN, BSN, CCRP;Renee Dillard Essex, MS, ACSM CEP, Exercise Physiologist   Medication changes reported     No   Fall or balance concerns reported    No   Warm-up and Cool-down Performed on first and last piece of equipment   VAD Patient? No   Pain Assessment   Currently in Pain? No/denies         Goals Met:  Proper associated with RPD/PD & O2 Sat Exercise tolerated well  Goals Unmet:  Not Applicable  Goals Comments:    Dr. Emily Filbert is Medical Director for [place] and [facility].

## 2015-10-07 ENCOUNTER — Encounter: Payer: BLUE CROSS/BLUE SHIELD | Admitting: *Deleted

## 2015-10-07 DIAGNOSIS — I2111 ST elevation (STEMI) myocardial infarction involving right coronary artery: Secondary | ICD-10-CM | POA: Diagnosis not present

## 2015-10-07 DIAGNOSIS — Z9861 Coronary angioplasty status: Secondary | ICD-10-CM

## 2015-10-07 NOTE — Progress Notes (Signed)
Daily Session Note  Patient Details  Name: Benjamin Carrillo MRN: [id number] Date of Birth: 03/09/1971 Referring Provider:  Strickland, James, MD  Encounter Date: 10/07/2015  Check In:     Session Check In - 10/07/15 0829    Check-In   Staff Present Renee MacMillan, MS, ACSM CEP, Exercise Physiologist;Susanne Bice, RN, BSN, CCRP;Kelly Hayes, BS, ACSM CEP, Exercise Physiologist   ER physicians immediately available to respond to emergencies See telemetry face sheet for immediately available ER MD   Medication changes reported     No   Fall or balance concerns reported    No   Warm-up and Cool-down Performed on first and last piece of equipment   VAD Patient? No   Pain Assessment   Currently in Pain? No/denies   Multiple Pain Sites No           Exercise Prescription Changes - 10/07/15 0800    Exercise Review   Progression Yes   Response to Exercise   Symptoms None   Comments Reviewed individualized exercise prescription and made increases per departmental policy. Exercise increases were discussed with the patient and they were able to perform the new work loads without issue (no signs or symptoms).    Duration Progress to 50 minutes of aerobic without signs/symptoms of physical distress   Intensity Rest + 30   Progression Continue progressive overload as per policy without signs/symptoms or physical distress.   Resistance Training   Training Prescription Yes   Weight 4   Reps 10-15   Interval Training   Interval Training No   Recumbant Elliptical   Level 4   RPM 40   Watts 40   Minutes 20   Elliptical   Level 15   Speed 3   Minutes 1      Goals Met:  Independence with exercise equipment Exercise tolerated well Personal goals reviewed No report of cardiac concerns or symptoms Strength training completed today  Goals Unmet:  Not Applicable  Goals Comments: Patient completed exercise prescription and all exercise goals during rehab session. The exercise was  tolerated well and the patient is progressing in the program.    Dr. Mark Miller is Medical Director for [facility] and [facility]. 

## 2015-10-09 DIAGNOSIS — I2111 ST elevation (STEMI) myocardial infarction involving right coronary artery: Secondary | ICD-10-CM

## 2015-10-09 DIAGNOSIS — Z9861 Coronary angioplasty status: Secondary | ICD-10-CM

## 2015-10-09 NOTE — Progress Notes (Signed)
Daily Session Note  Patient Details  Name: Benjamin Carrillo MRN: [id number] Date of Birth: October 14, 1970 Referring Provider:  Martinique, Peter M, MD  Encounter Date: 10/09/2015  Check In:     Session Check In - 10/09/15 0821    Check-In   Staff Present Heath Lark, RN, BSN, CCRP;Mary Kellie Shropshire, RN;Harlea Goetzinger, BS, ACSM EP-C, Exercise Physiologist   ER physicians immediately available to respond to emergencies See telemetry face sheet for immediately available ER MD   Medication changes reported     No   Fall or balance concerns reported    No   Warm-up and Cool-down Performed on first and last piece of equipment   VAD Patient? No   Pain Assessment   Currently in Pain? No/denies         Goals Met:  Proper associated with RPD/PD & O2 Sat Exercise tolerated well No report of cardiac concerns or symptoms Strength training completed today  Goals Unmet:  Not Applicable  Goals Comments:    Dr. Emily Filbert is Medical Director for [facility].

## 2015-10-10 ENCOUNTER — Other Ambulatory Visit: Payer: Self-pay

## 2015-10-10 MED ORDER — METOPROLOL TARTRATE 25 MG PO TABS: 25.0000 mg | ORAL_TABLET | Freq: Two times a day (BID) | ORAL | Status: DC

## 2015-10-10 NOTE — Telephone Encounter (Signed)
Eileen Stanford, PA-C at 09/18/2015 1:38 PM  metoprolol tartrate (LOPRESSOR) 25 MG tabletTake 1 tablet (25 mg total) by mouth 2 (two) times daily Current medicines are reviewed at length with the patient today. The patient does not have concerns regarding medicines.  The following changes have been made: no change

## 2015-10-10 NOTE — Progress Notes (Signed)
Cardiac Individual Treatment Plan  Patient Details  Name: HUNNER GARCON MRN: [id number] Date of Birth: Sep 20, 1970 Referring Provider:  Martinique, Peter M, MD  Initial Encounter Date:    Visit Diagnosis: S/P PTCA (percutaneous transluminal coronary angioplasty)  ST elevation myocardial infarction involving right coronary artery [facility])  History of percutaneous coronary intervention  Patient's Home Medications on Admission:  Current outpatient prescriptions:  .  ALPRAZolam (XANAX) 0.5 MG tablet, Take 0.5 mg by mouth daily as needed for anxiety., Disp: , Rfl:  .  aspirin 81 MG chewable tablet, Chew 1 tablet (81 mg total) by mouth daily., Disp: , Rfl:  .  atorvastatin (LIPITOR) 80 MG tablet, Take 1 tablet (80 mg total) by mouth daily at 6 PM., Disp: 30 tablet, Rfl: 6 .  calcium carbonate (TUMS - DOSED IN MG ELEMENTAL CALCIUM) 500 MG chewable tablet, Chew 1 tablet by mouth daily as needed for indigestion or heartburn., Disp: , Rfl:  .  metoprolol tartrate (LOPRESSOR) 25 MG tablet, Take 1 tablet (25 mg total) by mouth 2 (two) times daily., Disp: 180 tablet, Rfl: 1 .  nitroGLYCERIN (NITROSTAT) 0.4 MG SL tablet, Place 1 tablet (0.4 mg total) under the tongue every 5 (five) minutes as needed for chest pain (up to 3 doses)., Disp: 25 tablet, Rfl: 3 .  oxyCODONE (OXY IR/ROXICODONE) 5 MG immediate release tablet, Take 5 mg by mouth daily as needed. for pain, Disp: , Rfl: 0 .  ticagrelor (BRILINTA) 90 MG TABS tablet, Take 1 tablet (90 mg total) by mouth 2 (two) times daily., Disp: 60 tablet, Rfl: 11  Past Medical History: Past Medical History  Diagnosis Date  . Pinched nerve     L4  . CAD (coronary artery disease)     a. 09/08/15: inferior STEMI s/p DES of mid RCA  . HLD (hyperlipidemia)     Tobacco Use: History  Smoking status  . Never Smoker   Smokeless tobacco  . Not on file    Labs: Recent Review Flowsheet Data    Labs for ITP Cardiac and Pulmonary Rehab Latest Ref Rng 09/08/2015  09/09/2015   Cholestrol 0 - 200 mg/dL - 141   LDLCALC 0 - 99 mg/dL - 89   HDL >40 mg/dL - 27(L)   Trlycerides <150 mg/dL - 126   Hemoglobin A1c 4.8 - 5.6 % 5.6 -   TCO2 0 - 100 mmol/L 23 -       Exercise Target Goals:    Exercise Program Goal: Individual exercise prescription set with THRR, safety & activity barriers. Participant demonstrates ability to understand and report RPE using BORG scale, to self-measure pulse accurately, and to acknowledge the importance of the exercise prescription.  Exercise Prescription Goal: Starting with aerobic activity 30 plus minutes a day, 3 days per week for initial exercise prescription. Provide home exercise prescription and guidelines that participant acknowledges understanding prior to discharge.  Activity Barriers & Risk Stratification:     Activity Barriers & Risk Stratification - 09/25/15 1539    Activity Barriers & Risk Stratification   Activity Barriers Back Problems   Risk Stratification High      6 Minute Walk:     6 Minute Walk      09/25/15 1528       6 Minute Walk   Phase Initial     Distance 1330 feet     Walk Time 6 minutes     Resting HR 78 bpm     Resting BP 122/86 mmHg  Max Ex. HR 116 bpm     Max Ex. BP 144/84 mmHg     RPE 11     Symptoms Yes (comment)     Comments Some chest tightness        Initial Exercise Prescription:     Initial Exercise Prescription - 09/25/15 1500    Date of Initial Exercise Prescription   Date 09/25/15   Treadmill   MPH 2.5   Grade 0   Minutes 15   Bike   Level 0.4   Watts 15   Minutes 15   Recumbant Bike   Level 5   RPM 45   Watts 50   Minutes 15   NuStep   Level 4   Watts 50   Minutes 15   Arm Ergometer   Level 2   Watts 12   Minutes 10   Arm/Foot Ergometer   Level 4   Watts 15   Minutes 10   Cybex   Level 3   RPM 60   Minutes 15   Recumbant Elliptical   Level 2   RPM 45   Watts 25   Minutes 15   Elliptical   Level 1   Speed 3   Minutes 1    REL-XR   Level 3   Watts 45   Minutes 15   T5 Nustep   Level 3   Watts 25   Minutes 15   Biostep-RELP   Level 3   Watts 30   Minutes 15   Prescription Details   Frequency (times per week) 3   Duration Progress to 30 minutes of continuous aerobic without signs/symptoms of physical distress   Intensity   THRR REST +  30   Ratings of Perceived Exertion 11-15   Progression Continue progressive overload as per policy without signs/symptoms or physical distress.   Resistance Training   Training Prescription Yes   Weight 3   Reps 10-15      Exercise Prescription Changes:     Exercise Prescription Changes      10/01/15 0800 10/07/15 0800 10/09/15 0800       Exercise Review   Progression No  Has not started exercise, med review 09/25/15 Yes Yes     Response to Exercise   Symptoms  None None     Comments  Reviewed individualized exercise prescription and made increases per departmental policy. Exercise increases were discussed with the patient and they were able to perform the new work loads without issue (no signs or symptoms).       Duration  Progress to 50 minutes of aerobic without signs/symptoms of physical distress Progress to 50 minutes of aerobic without signs/symptoms of physical distress     Intensity  Rest + 30 Rest + 30     Progression  Continue progressive overload as per policy without signs/symptoms or physical distress. Continue progressive overload as per policy without signs/symptoms or physical distress.     Resistance Training   Training Prescription  Yes Yes     Weight  4 4     Reps  10-15 10-15     Interval Training   Interval Training  No No     Recumbant Elliptical   Level  4 5     RPM  40 65     Watts  40 40     Minutes  20 30     Elliptical   Level  15 4     Speed  3 4     Minutes  1 30        Discharge Exercise Prescription (Final Exercise Prescription Changes):     Exercise Prescription Changes - 10/09/15 0800    Exercise Review    Progression Yes   Response to Exercise   Symptoms None   Duration Progress to 50 minutes of aerobic without signs/symptoms of physical distress   Intensity Rest + 30   Progression Continue progressive overload as per policy without signs/symptoms or physical distress.   Resistance Training   Training Prescription Yes   Weight 4   Reps 10-15   Interval Training   Interval Training No   Recumbant Elliptical   Level 5   RPM 65   Watts 40   Minutes 30   Elliptical   Level 4   Speed 4   Minutes 30      Nutrition:  Target Goals: Understanding of nutrition guidelines, daily intake of sodium <1535m, cholesterol <2060m calories 30% from fat and 7% or less from saturated fats, daily to have 5 or more servings of fruits and vegetables.  Biometrics:     Pre Biometrics - 09/25/15 1527    Pre Biometrics   Height 5' 10.5" (1.791 m)   Weight 234 lb 8 oz (106.369 kg)   Waist Circumference 39 inches   Hip Circumference 41 inches   Waist to Hip Ratio 0.95 %   BMI (Calculated) 33.2       Nutrition Therapy Plan and Nutrition Goals:     Nutrition Therapy & Goals - 10/04/15 1152    Nutrition Therapy   Diet Instructed on a meal plan based on 2000 calories; DASH diet principles   Drug/Food Interactions Statins/Certain Fruits   Fiber 35 grams   Whole Grain Foods 3 servings   Protein 8 ounces/day   Saturated Fats 13 max. grams   Fruits and Vegetables 5 servings/day   Personal Nutrition Goals   Personal Goal #1 Increase fruit and vegetable intake to minimum of 5 servings per day.   Personal Goal #2 Include at least 30 grams carbohydrate per meal with preferred range of 45-60 gms.   Personal Goal #3 Read labels for saturated fat, trans fat and sodium.   Comments Patient is making positive changes in his diet. Diet is sometimes excessive in protein and low in healthy carbohydrates. He is also making lower sodium choices.Overall most of his food choices are low in saturated and trans  fat.       Nutrition Discharge: Rate Your Plate Scores:     Rate Your Plate - [id number]  Rate Your Plate Scores   Pre Score --  Patient will bring completed Rate Your Plate Questionnaire when he starts on January 16th.        Nutrition Goals Re-Evaluation:   Psychosocial: Target Goals: Acknowledge presence or absence of depression, maximize coping skills, provide positive support system. Participant is able to verbalize types and ability to use techniques and skills needed for reducing stress and depression.  Initial Review & Psychosocial Screening:     Initial Psych Review & Screening - 09/25/15 1827    Initial Review   Current issues with Current Depression;Current Stress Concerns   Source of Stress Concerns Occupation   Comments Scored 13 on PHQ-9.  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  Sometimes patient feels he is depressed .  Patient takes Xanax prn for anxiety.     Family Dynamics   Good Support System? Yes  Patient has one wife and 3 children.  Is a member of a large [employer], [place].     Barriers   Psychosocial barriers to participate in program The patient should benefit from training in stress management and relaxation.   Screening Interventions   Interventions Encouraged to exercise;Program counselor consult      Quality of Life Scores:     Quality of Life - 09/25/15 1834    Quality of Life Scores   Health/Function Pre --  Patient will bring completed questionnaire when he starts Cardiac Rehab on January 16th.        PHQ-9:     Recent Review Flowsheet Data    Depression screen [facility] 2/9 09/25/2015   Decreased Interest 1   Down, Depressed, Hopeless 1   PHQ - 2 Score 2   Altered sleeping 3   Tired, decreased energy 3    Change in appetite 3   Feeling bad or failure about yourself  1   Trouble concentrating 0   Moving slowly or fidgety/restless 1   Suicidal thoughts 0   PHQ-9 Score 13   Difficult doing work/chores Somewhat difficult      Psychosocial Evaluation and Intervention:     Psychosocial Evaluation - 10/02/15 1026    Psychosocial Evaluation & Interventions   Interventions Stress management education;Relaxation education;Encouraged to exercise with the program and follow exercise prescription   Comments Counselor met with Mr. Dorko today for initial psychosocial evaluation.  He is a 45 year old who had a heart attack on "Christmas Day" and had a stent inserted.  He has a strong support system with a spouse of 16 years, adult children and parents close by, as well as active involvement in his local church.  Mr. Murley reports having intermittent sleep and is not taking anything currently for this.  He has a good appetite and denies a history of anxiety or depression.  Counselor discussed the PHQ-9 scores of 13 which indicate some possible depressive symptoms and he agreed he has a fear of losing his job over this health issue, so his mood is impacted.  Mr. 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  He has goals to learn his exercise limitations since the heart attack, and to increase his stamina and strength.  Counselor recommended Mr. Sonn consult with his Dr. or pharmacist about use of a natural OTC to help with intermittent sleep concerns.  He is also recommended to consider counseling to help with ways to manage stress and learn relaxation as well as help with transitions and job stress currently.  Counselor will follow with Mr. Dorrance as needed.   Continued Psychosocial Services Needed Yes  Mr. Vicencio will benefit from  psychosocial education on relaxation and stress management and consistent exercise.      Psychosocial Re-Evaluation:   Vocational Rehabilitation: Provide vocational rehab assistance to qualifying candidates.   Vocational Rehab Evaluation & Intervention:     Vocational Rehab - 09/25/15 1540    Initial Vocational Rehab Evaluation & Intervention  Assessment shows need for Vocational Rehabilitation No      Education: Education Goals: Education classes will be provided on a weekly basis, covering required topics. Participant will state understanding/return demonstration of topics presented.  Learning Barriers/Preferences:     Learning Barriers/Preferences - 09/25/15 1539    Learning Barriers/Preferences   Learning Barriers None   Learning Preferences Written Material;Group Instruction;Video      Education Topics: General Nutrition Guidelines/Fats and Fiber: -Group instruction provided by verbal, written material, models and posters to present the general guidelines for heart healthy nutrition. Gives an explanation and review of dietary fats and fiber.   Controlling Sodium/Reading Food Labels: -Group verbal and written material supporting the discussion of sodium use in heart healthy nutrition. Review and explanation with models, verbal and written materials for utilization of the food label.   Exercise Physiology & Risk Factors: - Group verbal and written instruction with models to review the exercise physiology of the cardiovascular system and associated critical values. Details cardiovascular disease risk factors and the goals associated with each risk factor.   Aerobic Exercise & Resistance Training: - Gives group verbal and written discussion on the health impact of inactivity. On the components of aerobic and resistive training programs and the benefits of this training and how to safely progress through these programs.   Flexibility, Balance, General Exercise  Guidelines: - Provides group verbal and written instruction on the benefits of flexibility and balance training programs. Provides general exercise guidelines with specific guidelines to those with heart or lung disease. Demonstration and skill practice provided.   Stress Management: - Provides group verbal and written instruction about the health risks of elevated stress, cause of high stress, and healthy ways to reduce stress.   Depression: - Provides group verbal and written instruction on the correlation between heart/lung disease and depressed mood, treatment options, and the stigmas associated with seeking treatment.   Anatomy & Physiology of the Heart: - Group verbal and written instruction and models provide basic cardiac anatomy and physiology, with the coronary electrical and arterial systems. Review of: AMI, Angina, Valve disease, Heart Failure, Cardiac Arrhythmia, Pacemakers, and the ICD.          Cardiac Rehab from 10/09/2015 in [facility] Cardiac and Pulmonary Rehab   Date  10/07/15   Educator  SB   Instruction Review Code  2- meets goals/outcomes      Cardiac Procedures: - Group verbal and written instruction and models to describe the testing methods done to diagnose heart disease. Reviews the outcomes of the test results. Describes the treatment choices: Medical Management, Angioplasty, or Coronary Bypass Surgery.   Cardiac Medications: - Group verbal and written instruction to review commonly prescribed medications for heart disease. Reviews the medication, class of the drug, and side effects. Includes the steps to properly store meds and maintain the prescription regimen.   Go Sex-Intimacy & Heart Disease, Get SMART - Goal Setting: - Group verbal and written instruction through game format to discuss heart disease and the return to sexual intimacy. Provides group verbal and written material to discuss and apply goal setting through the application of the S.M.A.R.T.  Method.   Other Matters of the Heart: - Provides group verbal, written materials and models to describe Heart Failure, Angina, Valve Disease, and Diabetes in the realm of heart disease. Includes description of the disease process and treatment options available to the cardiac patient.   Exercise & Equipment Safety: - Individual verbal instruction and demonstration of equipment use and safety  with use of the equipment.      Cardiac Rehab from 10/09/2015 in [facility] Cardiac and Pulmonary Rehab   Date  09/25/15   Educator  DW   Instruction Review Code  1- partially meets, needs review/practice      Infection Prevention: - Provides verbal and written material to individual with discussion of infection control including proper hand washing and proper equipment cleaning during exercise session.      Cardiac Rehab from 10/09/2015 in [facility] Cardiac and Pulmonary Rehab   Date  09/25/15   Educator  DW   Instruction Review Code  2- meets goals/outcomes      Falls Prevention: - Provides verbal and written material to individual with discussion of falls prevention and safety.      Cardiac Rehab from 10/09/2015 in [facility] Cardiac and Pulmonary Rehab   Date  09/25/15   Educator  DW   Instruction Review Code  2- meets goals/outcomes      Diabetes: - Individual verbal and written instruction to review signs/symptoms of diabetes, desired ranges of glucose level fasting, after meals and with exercise. Advice that pre and post exercise glucose checks will be done for 3 sessions at entry of program.    Knowledge Questionnaire Score:     Knowledge Questionnaire Score - 09/25/15 1540    Knowledge Questionnaire Score   Pre Score --  Will bring completed questionnaire on Monday, January 16th.        Personal Goals and Risk Factors at Admission:     Personal Goals and Risk Factors at Admission - 09/25/15 1823    Personal Goals and Risk Factors on Admission    Weight Management Yes   Intervention  Learn and follow the exercise and diet guidelines while in the program. Utilize the nutrition and education classes to help gain knowledge of the diet and exercise expectations in the program   Admit Weight 234 lb 6.4 oz (106.323 kg)   Goal Weight 210 lb (95.255 kg)   Increase Aerobic Exercise and Physical Activity Yes   Intervention While in program, learn and follow the exercise prescription taught. Start at a low level workload and increase workload after able to maintain previous level for 30 minutes. Increase time before increasing intensity.   Understand more about Heart/Pulmonary Disease. Yes   Intervention While in program utilize professionals for any questions, and attend the education sessions. Great websites to use are www.americanheart.org or www.lung.org for reliable information.   Diabetes No   Hypertension Yes   Goal Participant will see blood pressure controlled within the values of 140/78m/Hg or within value directed by their physician.   Intervention Provide nutrition & aerobic exercise along with prescribed medications to achieve BP 140/90 or less.   Lipids Yes   Goal Cholesterol controlled with medications as prescribed, with individualized exercise RX and with personalized nutrition plan. Value goals: LDL < 780m HDL > 409mParticipant states understanding of desired cholesterol values and following prescriptions.   Intervention Provide nutrition & aerobic exercise along with prescribed medications to achieve LDL <1m51mDL >40mg44mStress Yes   Goal To meet with psychosocial counselor for stress and relaxation information and guidance. To state understanding of performing relaxation techniques and or identifying personal stressors.   Intervention Provide education on types of stress, identifiying stressors, and ways to cope with stress. Provide demonstration and active practice of relaxation techniques.      Personal Goals and Risk Factors Review:      Goals and  Risk  Factor Review      10/07/15 0833           Increase Aerobic Exercise and Physical Activity   Goals Progress/Improvement seen  Yes       Comments Bren still has days where he feels low energy and this is usually correlated with bad sleep the night before. However, he is still making gains in his aerobic endurance. He is younger and has a history of being physically active which and these factors contribute to his quick progress.           Personal Goals Discharge (Final Personal Goals and Risk Factors Review):      Goals and Risk Factor Review - 10/07/15 0833    Increase Aerobic Exercise and Physical Activity   Goals Progress/Improvement seen  Yes   Comments Filimon still has days where he feels low energy and this is usually correlated with bad sleep the night before. However, he is still making gains in his aerobic endurance. He is younger and has a history of being physically active which and these factors contribute to his quick progress.       ITP Comments:     ITP Comments      10/10/15 1229           ITP Comments Ready for 30 day review. Continue with ITP.          Comments:

## 2015-10-11 ENCOUNTER — Encounter: Payer: BLUE CROSS/BLUE SHIELD | Admitting: *Deleted

## 2015-10-11 DIAGNOSIS — I2111 ST elevation (STEMI) myocardial infarction involving right coronary artery: Secondary | ICD-10-CM

## 2015-10-11 DIAGNOSIS — Z9861 Coronary angioplasty status: Secondary | ICD-10-CM

## 2015-10-11 NOTE — Progress Notes (Signed)
Daily Session Note  Patient Details  Name: MICK TANGUMA MRN: [id number] Date of Birth: 1970-10-20 Referring Provider:  Martinique, Peter M, MD  Encounter Date: 10/11/2015  Check In:     Session Check In - 10/11/15 0918    Check-In   Staff Present Heath Lark, RN, BSN, CCRP;Ayaan Ringle, RN, Drusilla Kanner, MS, ACSM CEP, Exercise Physiologist   ER physicians immediately available to respond to emergencies See telemetry face sheet for immediately available ER MD   Medication changes reported     No   Fall or balance concerns reported    No   Warm-up and Cool-down Performed on first and last piece of equipment   VAD Patient? No   Pain Assessment   Currently in Pain? No/denies         Goals Met:  Proper associated with RPD/PD & O2 Sat Exercise tolerated well  Goals Unmet:  Not Applicable  Goals Comments: I asked Janis what was decided about him returning back to work since [employer] can not return to work on a blood thinner. He said  has the same regulations for [employer] as [employer].    Dr. Emily Filbert is Medical Director for [place] and [facility].

## 2015-10-11 NOTE — Progress Notes (Signed)
Cardiac Individual Treatment Plan  Patient Details  Name: Benjamin Carrillo MRN: [id number] Date of Birth: 1971/04/15 Referring Provider:  Martinique, Peter M, MD  Initial Encounter Date:    Visit Diagnosis: S/P PTCA (percutaneous transluminal coronary angioplasty)  ST elevation myocardial infarction involving right coronary artery [facility])  Patient's Home Medications on Admission:  Current outpatient prescriptions:  .  ALPRAZolam (XANAX) 0.5 MG tablet, Take 0.5 mg by mouth daily as needed for anxiety., Disp: , Rfl:  .  aspirin 81 MG chewable tablet, Chew 1 tablet (81 mg total) by mouth daily., Disp: , Rfl:  .  atorvastatin (LIPITOR) 80 MG tablet, Take 1 tablet (80 mg total) by mouth daily at 6 PM., Disp: 30 tablet, Rfl: 6 .  calcium carbonate (TUMS - DOSED IN MG ELEMENTAL CALCIUM) 500 MG chewable tablet, Chew 1 tablet by mouth daily as needed for indigestion or heartburn., Disp: , Rfl:  .  metoprolol tartrate (LOPRESSOR) 25 MG tablet, Take 1 tablet (25 mg total) by mouth 2 (two) times daily., Disp: 180 tablet, Rfl: 1 .  nitroGLYCERIN (NITROSTAT) 0.4 MG SL tablet, Place 1 tablet (0.4 mg total) under the tongue every 5 (five) minutes as needed for chest pain (up to 3 doses)., Disp: 25 tablet, Rfl: 3 .  oxyCODONE (OXY IR/ROXICODONE) 5 MG immediate release tablet, Take 5 mg by mouth daily as needed. for pain, Disp: , Rfl: 0 .  ticagrelor (BRILINTA) 90 MG TABS tablet, Take 1 tablet (90 mg total) by mouth 2 (two) times daily., Disp: 60 tablet, Rfl: 11  Past Medical History: Past Medical History  Diagnosis Date  . Pinched nerve     L4  . CAD (coronary artery disease)     a. 09/08/15: inferior STEMI s/p DES of mid RCA  . HLD (hyperlipidemia)     Tobacco Use: History  Smoking status  . Never Smoker   Smokeless tobacco  . Not on file    Labs: Recent Review Flowsheet Data    Labs for ITP Cardiac and Pulmonary Rehab Latest Ref Rng 09/08/2015 09/09/2015   Cholestrol 0 - 200 mg/dL - 141    LDLCALC 0 - 99 mg/dL - 89   HDL >40 mg/dL - 27(L)   Trlycerides <150 mg/dL - 126   Hemoglobin A1c 4.8 - 5.6 % 5.6 -   TCO2 0 - 100 mmol/L 23 -       Exercise Target Goals:    Exercise Program Goal: Individual exercise prescription set with THRR, safety & activity barriers. Participant demonstrates ability to understand and report RPE using BORG scale, to self-measure pulse accurately, and to acknowledge the importance of the exercise prescription.  Exercise Prescription Goal: Starting with aerobic activity 30 plus minutes a day, 3 days per week for initial exercise prescription. Provide home exercise prescription and guidelines that participant acknowledges understanding prior to discharge.  Activity Barriers & Risk Stratification:     Activity Barriers & Risk Stratification - 09/25/15 1539    Activity Barriers & Risk Stratification   Activity Barriers Back Problems   Risk Stratification High      6 Minute Walk:     6 Minute Walk      09/25/15 1528       6 Minute Walk   Phase Initial     Distance 1330 feet     Walk Time 6 minutes     Resting HR 78 bpm     Resting BP 122/86 mmHg     Max Ex. HR 116  bpm     Max Ex. BP 144/84 mmHg     RPE 11     Symptoms Yes (comment)     Comments Some chest tightness        Initial Exercise Prescription:     Initial Exercise Prescription - 09/25/15 1500    Date of Initial Exercise Prescription   Date 09/25/15   Treadmill   MPH 2.5   Grade 0   Minutes 15   Bike   Level 0.4   Watts 15   Minutes 15   Recumbant Bike   Level 5   RPM 45   Watts 50   Minutes 15   NuStep   Level 4   Watts 50   Minutes 15   Arm Ergometer   Level 2   Watts 12   Minutes 10   Arm/Foot Ergometer   Level 4   Watts 15   Minutes 10   Cybex   Level 3   RPM 60   Minutes 15   Recumbant Elliptical   Level 2   RPM 45   Watts 25   Minutes 15   Elliptical   Level 1   Speed 3   Minutes 1   REL-XR   Level 3   Watts 45   Minutes 15    T5 Nustep   Level 3   Watts 25   Minutes 15   Biostep-RELP   Level 3   Watts 30   Minutes 15   Prescription Details   Frequency (times per week) 3   Duration Progress to 30 minutes of continuous aerobic without signs/symptoms of physical distress   Intensity   THRR REST +  30   Ratings of Perceived Exertion 11-15   Progression Continue progressive overload as per policy without signs/symptoms or physical distress.   Resistance Training   Training Prescription Yes   Weight 3   Reps 10-15      Exercise Prescription Changes:     Exercise Prescription Changes      10/01/15 0800 10/07/15 0800 10/09/15 0800       Exercise Review   Progression No  Has not started exercise, med review 09/25/15 Yes Yes     Response to Exercise   Symptoms  None None     Comments  Reviewed individualized exercise prescription and made increases per departmental policy. Exercise increases were discussed with the patient and they were able to perform the new work loads without issue (no signs or symptoms).       Duration  Progress to 50 minutes of aerobic without signs/symptoms of physical distress Progress to 50 minutes of aerobic without signs/symptoms of physical distress     Intensity  Rest + 30 Rest + 30     Progression  Continue progressive overload as per policy without signs/symptoms or physical distress. Continue progressive overload as per policy without signs/symptoms or physical distress.     Resistance Training   Training Prescription  Yes Yes     Weight  4 4     Reps  10-15 10-15     Interval Training   Interval Training  No No     Recumbant Elliptical   Level  4 5     RPM  40 65     Watts  40 40     Minutes  20 30     Elliptical   Level  15 4     Speed  3 4  Minutes  1 30        Discharge Exercise Prescription (Final Exercise Prescription Changes):     Exercise Prescription Changes - 10/09/15 0800    Exercise Review   Progression Yes   Response to Exercise    Symptoms None   Duration Progress to 50 minutes of aerobic without signs/symptoms of physical distress   Intensity Rest + 30   Progression Continue progressive overload as per policy without signs/symptoms or physical distress.   Resistance Training   Training Prescription Yes   Weight 4   Reps 10-15   Interval Training   Interval Training No   Recumbant Elliptical   Level 5   RPM 65   Watts 40   Minutes 30   Elliptical   Level 4   Speed 4   Minutes 30      Nutrition:  Target Goals: Understanding of nutrition guidelines, daily intake of sodium <1542m, cholesterol <2034m calories 30% from fat and 7% or less from saturated fats, daily to have 5 or more servings of fruits and vegetables.  Biometrics:     Pre Biometrics - 09/25/15 1527    Pre Biometrics   Height 5' 10.5" (1.791 m)   Weight 234 lb 8 oz (106.369 kg)   Waist Circumference 39 inches   Hip Circumference 41 inches   Waist to Hip Ratio 0.95 %   BMI (Calculated) 33.2       Nutrition Therapy Plan and Nutrition Goals:     Nutrition Therapy & Goals - 10/04/15 1152    Nutrition Therapy   Diet Instructed on a meal plan based on 2000 calories; DASH diet principles   Drug/Food Interactions Statins/Certain Fruits   Fiber 35 grams   Whole Grain Foods 3 servings   Protein 8 ounces/day   Saturated Fats 13 max. grams   Fruits and Vegetables 5 servings/day   Personal Nutrition Goals   Personal Goal #1 Increase fruit and vegetable intake to minimum of 5 servings per day.   Personal Goal #2 Include at least 30 grams carbohydrate per meal with preferred range of 45-60 gms.   Personal Goal #3 Read labels for saturated fat, trans fat and sodium.   Comments Patient is making positive changes in his diet. Diet is sometimes excessive in protein and low in healthy carbohydrates. He is also making lower sodium choices.Overall most of his food choices are low in saturated and trans fat.       Nutrition Discharge: Rate Your  Plate Scores:     Rate Your Plate - [id number]/[id number]  Rate Your Plate Scores   Pre Score --  Patient will bring completed Rate Your Plate Questionnaire when he starts on January 16th.        Nutrition Goals Re-Evaluation:   Psychosocial: Target Goals: Acknowledge presence or absence of depression, maximize coping skills, provide positive support system. Participant is able to verbalize types and ability to use techniques and skills needed for reducing stress and depression.  Initial Review & Psychosocial Screening:     Initial Psych Review & Screening - 09/25/15 1827    Initial Review   Current issues with Current Depression;Current Stress Concerns   Source of Stress Concerns Occupation   Comments Scored 13 on PHQ-9.  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  Sometimes patient feels he is depressed .  Patient takes Xanax prn for anxiety.     Family Dynamics   Good Support System? Yes  Patient has one wife and 3 children.  Is a member of a large [employer], [place].     Barriers   Psychosocial barriers to participate in program The patient should benefit from training in stress management and relaxation.   Screening Interventions   Interventions Encouraged to exercise;Program counselor consult      Quality of Life Scores:     Quality of Life - 09/25/15 1834    Quality of Life Scores   Health/Function Pre --  Patient will bring completed questionnaire when he starts Cardiac Rehab on January 16th.        PHQ-9:     Recent Review Flowsheet Data    Depression screen [facility] 2/9 09/25/2015   Decreased Interest 1   Down, Depressed, Hopeless 1   PHQ - 2 Score 2   Altered sleeping 3   Tired, decreased energy 3   Change in appetite 3   Feeling bad or  failure about yourself  1   Trouble concentrating 0   Moving slowly or fidgety/restless 1   Suicidal thoughts 0   PHQ-9 Score 13   Difficult doing work/chores Somewhat difficult      Psychosocial Evaluation and Intervention:     Psychosocial Evaluation - 10/02/15 1026    Psychosocial Evaluation & Interventions   Interventions Stress management education;Relaxation education;Encouraged to exercise with the program and follow exercise prescription   Comments Counselor met with Mr. Racicot today for initial psychosocial evaluation.  He is a 45 year old who had a heart attack on "Christmas Day" and had a stent inserted.  He has a strong support system with a spouse of 16 years, adult children and parents close by, as well as active involvement in his local church.  Mr. Mentink reports having intermittent sleep and is not taking anything currently for this.  He has a good appetite and denies a history of anxiety or depression.  Counselor discussed the PHQ-9 scores of 13 which indicate some possible depressive symptoms and he agreed he has a fear of losing his job over this health issue, so his mood is impacted.  Mr. 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  He has goals to learn his exercise limitations since the heart attack, and to increase his stamina and strength.  Counselor recommended Mr. Savastano consult with his Dr. or pharmacist about use of a natural OTC to help with intermittent sleep concerns.  He is also recommended to consider counseling to help with ways to manage stress and learn relaxation as well as help with transitions and job stress currently.  Counselor will follow with Mr. Onder as needed.   Continued Psychosocial Services Needed Yes  Mr. Desmith will benefit from psychosocial education on relaxation and  stress management and consistent exercise.      Psychosocial Re-Evaluation:     Psychosocial Re-Evaluation      10/11/15 0919           Psychosocial Re-Evaluation   Interventions Encouraged to attend Cardiac Rehabilitation for the exercise       Comments  I asked Youcef what was decided about him returning back to work since [employer] can not return to work on a blood thinner. He said [place] has the same regulations for [employer] as [employer].           Vocational Rehabilitation: Provide vocational rehab assistance to qualifying candidates.   Vocational Rehab Evaluation & Intervention:     Vocational Rehab - 09/25/15 1540    Initial Vocational Rehab Evaluation & Intervention   Assessment shows need for Vocational Rehabilitation No      Education: Education Goals: Education classes will be provided on a weekly basis, covering required topics. Participant will state understanding/return demonstration of topics presented.  Learning Barriers/Preferences:     Learning Barriers/Preferences - 09/25/15 1539    Learning Barriers/Preferences   Learning Barriers None   Learning Preferences Written Material;Group Instruction;Video      Education Topics: General Nutrition Guidelines/Fats and Fiber: -Group instruction provided by verbal, written material, models and posters to present the general guidelines for heart healthy nutrition. Gives an explanation and review of dietary fats and fiber.   Controlling Sodium/Reading Food Labels: -Group verbal and written material supporting the discussion of sodium use in heart healthy nutrition. Review and explanation with models, verbal and written materials for utilization of the food label.   Exercise Physiology & Risk Factors: - Group verbal and written instruction with models to review the exercise physiology of the cardiovascular system and associated critical values. Details cardiovascular disease risk  factors and the goals associated with each risk factor.   Aerobic Exercise & Resistance Training: - Gives group verbal and written discussion on the health impact of inactivity. On the components of aerobic and resistive training programs and the benefits of this training and how to safely progress through these programs.   Flexibility, Balance, General Exercise Guidelines: - Provides group verbal and written instruction on the benefits of flexibility and balance training programs. Provides general exercise guidelines with specific guidelines to those with heart or lung disease. Demonstration and skill practice provided.   Stress Management: - Provides group verbal and written instruction about the health risks of elevated stress, cause of high stress, and healthy ways to reduce stress.   Depression: - Provides group verbal and written instruction on the correlation between heart/lung disease and depressed mood, treatment options, and the stigmas associated with seeking treatment.   Anatomy & Physiology of the Heart: - Group verbal and written instruction and models provide basic cardiac anatomy and physiology, with the coronary electrical and arterial systems. Review of: AMI, Angina, Valve disease, Heart Failure, Cardiac Arrhythmia, Pacemakers, and the ICD.          Cardiac Rehab from 10/09/2015 in [facility] Cardiac and Pulmonary Rehab   Date  10/07/15   Educator  SB   Instruction Review Code  2- meets goals/outcomes      Cardiac Procedures: - Group verbal and written instruction and models to describe the testing methods done to diagnose heart disease. Reviews the outcomes of the test results. Describes the treatment choices: Medical Management, Angioplasty, or Coronary Bypass Surgery.   Cardiac Medications: - Group verbal and written instruction to review commonly prescribed medications for heart disease. Reviews the medication, class of the drug, and side effects. Includes the steps  to properly store meds and maintain the prescription regimen.   Go Sex-Intimacy & Heart Disease, Get SMART - Goal Setting: - Group verbal and written instruction through game format to discuss heart disease and the return  to sexual intimacy. Provides group verbal and written material to discuss and apply goal setting through the application of the S.M.A.R.T. Method.   Other Matters of the Heart: - Provides group verbal, written materials and models to describe Heart Failure, Angina, Valve Disease, and Diabetes in the realm of heart disease. Includes description of the disease process and treatment options available to the cardiac patient.   Exercise & Equipment Safety: - Individual verbal instruction and demonstration of equipment use and safety with use of the equipment.      Cardiac Rehab from 10/09/2015 in [facility] Cardiac and Pulmonary Rehab   Date  09/25/15   Educator  DW   Instruction Review Code  1- partially meets, needs review/practice      Infection Prevention: - Provides verbal and written material to individual with discussion of infection control including proper hand washing and proper equipment cleaning during exercise session.      Cardiac Rehab from 10/09/2015 in [facility] Cardiac and Pulmonary Rehab   Date  09/25/15   Educator  DW   Instruction Review Code  2- meets goals/outcomes      Falls Prevention: - Provides verbal and written material to individual with discussion of falls prevention and safety.      Cardiac Rehab from 10/09/2015 in [facility] Cardiac and Pulmonary Rehab   Date  09/25/15   Educator  DW   Instruction Review Code  2- meets goals/outcomes      Diabetes: - Individual verbal and written instruction to review signs/symptoms of diabetes, desired ranges of glucose level fasting, after meals and with exercise. Advice that pre and post exercise glucose checks will be done for 3 sessions at entry of program.    Knowledge Questionnaire Score:     Knowledge  Questionnaire Score - 09/25/15 1540    Knowledge Questionnaire Score   Pre Score --  Will bring completed questionnaire on Monday, January 16th.        Personal Goals and Risk Factors at Admission:     Personal Goals and Risk Factors at Admission - 09/25/15 1823    Personal Goals and Risk Factors on Admission    Weight Management Yes   Intervention Learn and follow the exercise and diet guidelines while in the program. Utilize the nutrition and education classes to help gain knowledge of the diet and exercise expectations in the program   Admit Weight 234 lb 6.4 oz (106.323 kg)   Goal Weight 210 lb (95.255 kg)   Increase Aerobic Exercise and Physical Activity Yes   Intervention While in program, learn and follow the exercise prescription taught. Start at a low level workload and increase workload after able to maintain previous level for 30 minutes. Increase time before increasing intensity.   Understand more about Heart/Pulmonary Disease. Yes   Intervention While in program utilize professionals for any questions, and attend the education sessions. Great websites to use are www.americanheart.org or www.lung.org for reliable information.   Diabetes No   Hypertension Yes   Goal Participant will see blood pressure controlled within the values of 140/90m/Hg or within value directed by their physician.   Intervention Provide nutrition & aerobic exercise along with prescribed medications to achieve BP 140/90 or less.   Lipids Yes   Goal Cholesterol controlled with medications as prescribed, with individualized exercise RX and with personalized nutrition plan. Value goals: LDL < 752m HDL > 4063mParticipant states understanding of desired cholesterol values and following prescriptions.   Intervention Provide nutrition & aerobic exercise along  with prescribed medications to achieve LDL <43m, HDL >468m   Stress Yes   Goal To meet with psychosocial counselor for stress and relaxation  information and guidance. To state understanding of performing relaxation techniques and or identifying personal stressors.   Intervention Provide education on types of stress, identifiying stressors, and ways to cope with stress. Provide demonstration and active practice of relaxation techniques.      Personal Goals and Risk Factors Review:      Goals and Risk Factor Review      10/07/15 0833           Increase Aerobic Exercise and Physical Activity   Goals Progress/Improvement seen  Yes       Comments DaTysheemtill has days where he feels low energy and this is usually correlated with bad sleep the night before. However, he is still making gains in his aerobic endurance. He is younger and has a history of being physically active which and these factors contribute to his quick progress.           Personal Goals Discharge (Final Personal Goals and Risk Factors Review):      Goals and Risk Factor Review - 10/07/15 0833    Increase Aerobic Exercise and Physical Activity   Goals Progress/Improvement seen  Yes   Comments DaDajohntill has days where he feels low energy and this is usually correlated with bad sleep the night before. However, he is still making gains in his aerobic endurance. He is younger and has a history of being physically active which and these factors contribute to his quick progress.       ITP Comments:     ITP Comments      10/10/15 1229           ITP Comments Ready for 30 day review. Continue with ITP.          Comments: I asked DaTrevinohat was decided about him returning back to work since [employer] not return to work on a blood thinner. He said [place] has the same regulations for [employer]

## 2015-10-14 ENCOUNTER — Encounter: Payer: BLUE CROSS/BLUE SHIELD | Admitting: *Deleted

## 2015-10-14 DIAGNOSIS — I2111 ST elevation (STEMI) myocardial infarction involving right coronary artery: Secondary | ICD-10-CM | POA: Diagnosis not present

## 2015-10-14 DIAGNOSIS — Z9861 Coronary angioplasty status: Secondary | ICD-10-CM

## 2015-10-14 NOTE — Progress Notes (Signed)
Daily Session Note  Patient Details  Name: Benjamin Carrillo MRN: [id number] Date of Birth: Jun 19, 1971 Referring Provider:  Martinique, Peter M, MD  Encounter Date: 10/14/2015  Check In:     Session Check In - 10/14/15 0828    Check-In   Staff Present Candiss Norse, MS, ACSM CEP, Exercise Physiologist;Susanne Bice, RN, BSN, Laveda Norman, BS, ACSM CEP, Exercise Physiologist   ER physicians immediately available to respond to emergencies See telemetry face sheet for immediately available ER MD   Medication changes reported     No   Fall or balance concerns reported    No   Warm-up and Cool-down Performed on first and last piece of equipment   VAD Patient? No   Pain Assessment   Currently in Pain? No/denies   Multiple Pain Sites No         Goals Met:  Independence with exercise equipment Exercise tolerated well No report of cardiac concerns or symptoms Strength training completed today  Goals Unmet:  Not Applicable  Goals Comments: Patient completed exercise prescription and all exercise goals during rehab session. The exercise was tolerated well and the patient is progressing in the program.    Dr. Emily Filbert is Medical Director for [facility].

## 2015-10-16 ENCOUNTER — Telehealth: Payer: Self-pay | Admitting: Cardiology

## 2015-10-16 ENCOUNTER — Encounter: Payer: BLUE CROSS/BLUE SHIELD | Attending: Cardiology

## 2015-10-16 DIAGNOSIS — Z9861 Coronary angioplasty status: Secondary | ICD-10-CM | POA: Diagnosis present

## 2015-10-16 DIAGNOSIS — I2111 ST elevation (STEMI) myocardial infarction involving right coronary artery: Secondary | ICD-10-CM

## 2015-10-16 NOTE — Progress Notes (Signed)
Daily Session Note  Patient Details  Name: DANTONIO JUSTEN MRN: [id number] Date of Birth: 30-Sep-1970 Referring Provider:  Martinique, Peter M, MD  Encounter Date: 10/16/2015  Check In:     Session Check In - 10/16/15 0903    Check-In   Staff Present Heath Lark, RN, BSN, CCRP;Renee Dillard [place], MS, ACSM CEP, Exercise Physiologist;Earlisha Sharples, BS, ACSM EP-C, Exercise Physiologist   ER physicians immediately available to respond to emergencies See telemetry face sheet for immediately available ER MD   Medication changes reported     No   Fall or balance concerns reported    No   Warm-up and Cool-down Performed on first and last piece of equipment   VAD Patient? No   Pain Assessment   Currently in Pain? No/denies         Goals Met:  Proper associated with RPD/PD & O2 Sat Exercise tolerated well No report of cardiac concerns or symptoms Strength training completed today  Goals Unmet:  Not Applicable  Goals Comments:    Dr. Emily Filbert is Medical Director for [facility].

## 2015-10-16 NOTE — Telephone Encounter (Signed)
New message    Patient seen APP on Jan  5 . A notes was written to return to work . His job will not accept it . Need another note saying light duties,   Fax # [phone] attention Bertram Savin

## 2015-10-16 NOTE — Addendum Note (Signed)
Addended by: Lynford Humphrey on: 10/16/2015 07:23 AM   Modules accepted: Orders

## 2015-10-16 NOTE — Telephone Encounter (Signed)
Returned call to patient.He stated he needed a letter from [place] to return to work.Letter needs to have light duty.Advised Dr.Jordan out of office until Friday 10/18/15.Advised I will check with him then and call you back.

## 2015-10-18 ENCOUNTER — Ambulatory Visit (INDEPENDENT_AMBULATORY_CARE_PROVIDER_SITE_OTHER): Payer: BLUE CROSS/BLUE SHIELD | Admitting: Cardiology

## 2015-10-18 ENCOUNTER — Encounter: Payer: Self-pay | Admitting: Cardiology

## 2015-10-18 ENCOUNTER — Encounter: Payer: Self-pay | Admitting: *Deleted

## 2015-10-18 VITALS — BP 110/88 | HR 66 | Ht 70.0 in | Wt 231.6 lb

## 2015-10-18 DIAGNOSIS — I2510 Atherosclerotic heart disease of native coronary artery without angina pectoris: Secondary | ICD-10-CM

## 2015-10-18 DIAGNOSIS — E785 Hyperlipidemia, unspecified: Secondary | ICD-10-CM

## 2015-10-18 DIAGNOSIS — Z006 Encounter for examination for normal comparison and control in clinical research program: Secondary | ICD-10-CM

## 2015-10-18 NOTE — Patient Instructions (Signed)
We will check fasting lab work and schedule you for a stress test  Continue your current medication  I will see you in 4 months.

## 2015-10-18 NOTE — Telephone Encounter (Signed)
Patient had appointment with Dr.Jordan this morning.Treadmill scheduled 10/22/15.Letter to be done after treadmill.

## 2015-10-18 NOTE — Progress Notes (Signed)
Cardiology Office Note   Date:  10/18/2015   ID:  CAMMRON ALLSOPP, DOB 15-Nov-1970, MRN [id number]  PCP:  Everlean Alstrom, MD  Cardiologist:  Dr. Martinique  Post hospital follow up- CAD    History of Present Illness: Benjamin Carrillo is a 45 y.o. male with a history of HLD and recently diagnosed CAD: inferior STEMI s/p DES of mid RCA who is seen for follow up.  He was  admitted to [facility] 09/08/15-09/10/15 with an inferior STEMI.  Code STEMI was called and he was brought to the [facility] lab emergently. + Family history of CAD in both grandfathers. Rare EtOH. He does report drinking a lot of energy drinks and using thermal cut products for weightlifting. Cardiac catheterization on 09/08/15 demonstrated  single vessel occlusive CAD in RCA s/p successful stenting of the mid RCA with a DES. He had good LV function. 2D ECHO 09/10/15: Normal LV systolic function; [id number]; trace MR and TR. He was started on ASA and Brilinta as well as atorvastatin 80mg  and Lopressor 25mg  BID.   On follow up today he is doing very well. No recurrent chest pain or SOB. Notes an annular rash left hip. No significant bruising. Tolerating medications well. 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     Past Medical History  Diagnosis Date  . Pinched nerve     L4  . CAD (coronary artery disease)     a. 09/08/15: inferior STEMI s/p DES of mid RCA  . HLD (hyperlipidemia)     Past Surgical History  Procedure Laterality Date  . Inguinal hernia repair    . Cardiac catheterization N/A 09/08/2015    Procedure: Left Heart Cath and Coronary Angiography;  Surgeon: Blakley Michna M Martinique, MD;  Location: [place] [facility];  Service: Cardiovascular;  Laterality: N/A;  . Cardiac catheterization N/A 09/08/2015    Procedure: Coronary Stent Intervention;  Surgeon: Chestina Komatsu M Martinique, MD;  Location: [place] [facility];  Service: Cardiovascular;  Laterality:  N/A;  mid rca      Current Outpatient Prescriptions  Medication Sig Dispense Refill  . ALPRAZolam (XANAX) 0.5 MG tablet Take 0.5 mg by mouth daily as needed for anxiety.    Marland Kitchen aspirin 81 MG chewable tablet Chew 1 tablet (81 mg total) by mouth daily.    Marland Kitchen atorvastatin (LIPITOR) 80 MG tablet Take 1 tablet (80 mg total) by mouth daily at 6 PM. 30 tablet 6  . calcium carbonate (TUMS - DOSED IN MG ELEMENTAL CALCIUM) 500 MG chewable tablet Chew 1 tablet by mouth daily as needed for indigestion or heartburn.    . metoprolol tartrate (LOPRESSOR) 25 MG tablet Take 1 tablet (25 mg total) by mouth 2 (two) times daily. 180 tablet 1  . nitroGLYCERIN (NITROSTAT) 0.4 MG SL tablet Place 1 tablet (0.4 mg total) under the tongue every 5 (five) minutes as needed for chest pain (up to 3 doses). 25 tablet 3  . oxyCODONE (OXY IR/ROXICODONE) 5 MG immediate release tablet Take 5 mg by mouth daily as needed. for pain  0  . ticagrelor (BRILINTA) 90 MG TABS tablet Take 1 tablet (90 mg total) by mouth 2 (two) times daily. 60 tablet 11  . triamcinolone cream (KENALOG) 0.1 % Apply 1 application topically 2 (two) times daily as needed.  0   No current facility-administered medications for this visit.    Allergies:  Penicillins and Erythromycin    Social History:  The patient  reports that he has never smoked. He does not have any smokeless tobacco history on file. He reports that he drinks alcohol. He reports that he does not use illicit drugs.   Family History:  The patient's family history includes CAD in his maternal grandfather and paternal grandfather.    ROS:  Please see the history of present illness.   Otherwise, review of systems are positive for NONE.   All other systems are reviewed and negative.    PHYSICAL EXAM: VS:  BP 110/88 mmHg  Pulse 66  Ht 5\' 10"  (1.778 m)  Wt 105.053 kg (231 lb 9.6 oz)  BMI 33.23 kg/m2 , BMI Body mass index is 33.23 kg/(m^2). GEN: Well nourished, well developed, in no acute  distress HEENT: normal Neck: no JVD, carotid bruits, or masses Cardiac: RRR; no murmurs, rubs, or gallops,no edema  Respiratory:  clear to auscultation bilaterally, normal work of breathing GI: soft, nontender, nondistended, + BS MS: no deformity or atrophy Skin: warm and dry, no rash Neuro:  Strength and sensation are intact Psych: euthymic mood, full affect   EKG:  EKG done 09/19/15 showed small Q waves and T wave inversion in leads 3 and avf.    Recent Labs: 09/08/2015: Magnesium 1.8; TSH 1.032 09/09/2015: Hemoglobin 17.1*; Platelets 200 09/10/2015: ALT 60 09/24/2015: BUN 12; Creatinine, Ser 1.36*; Potassium 4.6; Sodium 144    Lipid Panel    Component Value Date/Time   CHOL 141 09/09/2015 0423   TRIG 126 09/09/2015 0423   HDL 27* 09/09/2015 0423   CHOLHDL 5.2 09/09/2015 0423   VLDL 25 09/09/2015 0423   LDLCALC 89 09/09/2015 0423      Wt Readings from Last 3 Encounters:  10/18/15 105.053 kg (231 lb 9.6 oz)  09/25/15 106.369 kg (234 lb 8 oz)  09/19/15 106.323 kg (234 lb 6.4 oz)      Other studies Reviewed: Additional studies/ records that were reviewed today include: LHC, 2D ECHO Review of the above records demonstrates:    2D ECHO: 09/10/2015 LV EF: 55% -  60% Study Conclusions - Left ventricle: The cavity size was normal. Wall thickness was increased in a pattern of mild LVH. Systolic function was normal. The estimated ejection fraction was in the range of 55% to 60%. Wall motion was normal; there were no regional wall motion abnormalities. Doppler parameters are consistent with abnormal left ventricular relaxation (grade 1 diastolic dysfunction). Impressions: - Normal LV systolic function; grade 1 diastolic dysfunction; trace MR and TR.    ASSESSMENT AND PLAN:  Benjamin Carrillo is a 45 y.o. male with a history of HLD and  CAD with  inferior STEMI s/p DES of mid RCA   CAD/ inferior STEMI: S/p DES of mid RCA. DAPT for one year. EF 55-65%.  Continue high dose statin and beta blocker as BP allows -- 2D ECHO 09/10/15: Normal LV systolic function; [id number]; trace MR and TR. -- I discussed his cath and Echo findings. I explained that based on his angiogram he is at low risk for recurrent infarction in the near future. He did have an infarct at a young age and therefore needs to continue with appropriate medical therapy, lifestyle modification, and avoidance of energy drinks. He should be able to return to work without restrictions- he is now 6 weeks out from his MI. Given the physical demands of his work I have recommended a follow up ETT at this point. If  OK I see no reason he cannot return to work. I did place a copy of his job description in his electronic record.   HLD: LDL 89: goal <70. Continue on high dose statin. Will schedule for fasting lab work.  Mild AKI: creat 1.36. Will repeat with fasting lab work.   Current medicines are reviewed at length with the patient today.  The patient does not have concerns regarding medicines.  The following changes have been made:  no change  Labs/ tests ordered today include:   Orders Placed This Encounter  Procedures  . Lipid panel  . Basic metabolic panel  . Hepatic function panel  . Exercise Tolerance Test    Disposition:   FU with  Dr Martinique in 4 months   Signed, Laquan Beier Martinique, MD  10/18/2015 10:41 AM    [address], [place], [place]  [address] Phone: [phone]; Fax: [phone]

## 2015-10-18 NOTE — Progress Notes (Signed)
Benjamin Carrillo came in to further discuss the DAL-GENE trial. The study requirements and expectations were discussed with Benjamin Carrillo including risk and benefits. He was given the opportunity to read the consent and ask questions. The consent was signed prior to the study required blood draw. A copy of the signed consent was given to the patient.

## 2015-10-21 ENCOUNTER — Encounter: Payer: BLUE CROSS/BLUE SHIELD | Admitting: *Deleted

## 2015-10-21 DIAGNOSIS — I2111 ST elevation (STEMI) myocardial infarction involving right coronary artery: Secondary | ICD-10-CM

## 2015-10-21 DIAGNOSIS — Z9861 Coronary angioplasty status: Secondary | ICD-10-CM

## 2015-10-21 NOTE — Progress Notes (Signed)
Daily Session Note  Patient Details  Name: DARROL BRANDENBURG MRN: [id number] Date of Birth: Feb 17, 1971 Referring Provider:  Martinique, Peter M, MD  Encounter Date: 10/21/2015  Check In:     Session Check In - 10/21/15 0827    Check-In   Staff Present Candiss Norse, MS, ACSM CEP, Exercise Physiologist;Susanne Bice, RN, BSN, Laveda Norman, BS, ACSM CEP, Exercise Physiologist   Supervising physician immediately available to respond to emergencies See telemetry face sheet for immediately available ER MD   Medication changes reported     No   Fall or balance concerns reported    No   Warm-up and Cool-down Performed on first and last piece of equipment   Resistance Training Performed No   VAD Patient? No   Pain Assessment   Currently in Pain? No/denies   Multiple Pain Sites No         Goals Met:  Independence with exercise equipment Exercise tolerated well No report of cardiac concerns or symptoms Strength training completed today  Goals Unmet:  Not Applicable  Goals Comments: Patient completed exercise prescription and all exercise goals during rehab session. The exercise was tolerated well and the patient is progressing in the program.     Dr. Emily Filbert is Medical Director for [facility] and [facility].

## 2015-10-22 ENCOUNTER — Ambulatory Visit (HOSPITAL_COMMUNITY)
Admission: RE | Admit: 2015-10-22 | Discharge: 2015-10-22 | Disposition: A | Discharge status: Home / Self Care | Payer: BLUE CROSS/BLUE SHIELD | Source: Ambulatory Visit | Attending: Cardiology | Admitting: Cardiology

## 2015-10-22 ENCOUNTER — Encounter: Payer: Self-pay | Admitting: *Deleted

## 2015-10-22 DIAGNOSIS — E785 Hyperlipidemia, unspecified: Secondary | ICD-10-CM | POA: Diagnosis not present

## 2015-10-22 DIAGNOSIS — Z006 Encounter for examination for normal comparison and control in clinical research program: Secondary | ICD-10-CM

## 2015-10-22 DIAGNOSIS — I2510 Atherosclerotic heart disease of native coronary artery without angina pectoris: Secondary | ICD-10-CM | POA: Diagnosis not present

## 2015-10-22 LAB — EXERCISE TOLERANCE TEST
CHL RATE OF PERCEIVED EXERTION: 15
CSEPEDS: 0 s
CSEPEW: 13.4 METS
Exercise duration (min): 12 min
MPHR: 174 {beats}/min
Peak HR: 160 {beats}/min
Percent HR: 91 %
Rest HR: 68 {beats}/min

## 2015-10-22 NOTE — Progress Notes (Signed)
I notified Benjamin Carrillo of his results of the Dal-Gene genetic test. He does not have the genotype to continue in the Dal-Gene study.

## 2015-10-23 ENCOUNTER — Encounter: Payer: BLUE CROSS/BLUE SHIELD | Admitting: *Deleted

## 2015-10-23 DIAGNOSIS — I2111 ST elevation (STEMI) myocardial infarction involving right coronary artery: Secondary | ICD-10-CM | POA: Diagnosis not present

## 2015-10-23 DIAGNOSIS — Z9861 Coronary angioplasty status: Secondary | ICD-10-CM

## 2015-10-23 LAB — LIPID PANEL
Cholesterol: 69 mg/dL — ABNORMAL LOW (ref 125–200)
HDL: 30 mg/dL — ABNORMAL LOW (ref >=40)
LDL Cholesterol: 28 mg/dL (ref <130)
Total CHOL/HDL Ratio: 2.3 Ratio (ref <=5.0)
Triglycerides: 55 mg/dL (ref <150)
VLDL: 11 mg/dL (ref <30)

## 2015-10-23 LAB — HEPATIC FUNCTION PANEL
ALT: 66 U/L — AB (ref 9–46)
AST: 37 U/L (ref 10–40)
Albumin: 4.2 g/dL (ref 3.6–5.1)
Alkaline Phosphatase: 66 U/L (ref 40–115)
Bilirubin, Direct: 0.2 mg/dL (ref <=0.2)
Indirect Bilirubin: 0.7 mg/dL (ref 0.2–1.2)
TOTAL PROTEIN: 6.4 g/dL (ref 6.1–8.1)
Total Bilirubin: 0.9 mg/dL (ref 0.2–1.2)

## 2015-10-23 LAB — BASIC METABOLIC PANEL
BUN: 12 mg/dL (ref 7–25)
CO2: 27 mmol/L (ref 20–31)
Calcium: 9.4 mg/dL (ref 8.6–10.3)
Chloride: 109 mmol/L (ref 98–110)
Creat: 1.24 mg/dL (ref 0.60–1.35)
Glucose, Bld: 76 mg/dL (ref 65–99)
Potassium: 4.6 mmol/L (ref 3.5–5.3)
Sodium: 141 mmol/L (ref 135–146)

## 2015-10-23 NOTE — Progress Notes (Signed)
Daily Session Note  Patient Details  Name: Benjamin Carrillo MRN: [id number] Date of Birth: 02/20/1971 Referring Provider:  Shepard General, MD  Encounter Date: 10/23/2015  Check In:     Session Check In - 10/23/15 0906    Check-In   Location [facility]   Staff Present Gerlene Burdock, RN, Drusilla Kanner, MS, ACSM CEP, Exercise Physiologist;Susanne Bice, RN, BSN, CCRP   Supervising physician immediately available to respond to emergencies See telemetry face sheet for immediately available ER MD   Medication changes reported     No   Fall or balance concerns reported    No   Warm-up and Cool-down Performed on first and last piece of equipment   Resistance Training Performed Yes   VAD Patient? No   Pain Assessment   Currently in Pain? No/denies   Multiple Pain Sites No           Exercise Prescription Changes - 10/23/15 0900    Exercise Review   Progression Yes   Response to Exercise   Symptoms None   Comments Increased [name] on the elliptical to further challenge and progress him in aerobic exercise. He can consistently exercise for the entire 45 min of aerobic exercise.   Frequency Add 2 additional days to program exercise sessions.  [name] is exercising at home on days he is not in HT   Duration Progress to 50 minutes of aerobic without signs/symptoms of physical distress   Intensity Rest + 30   Progression Continue progressive overload as per policy without signs/symptoms or physical distress.   Resistance Training   Training Prescription Yes   Weight 7   Reps 10-15   Interval Training   Interval Training Yes   Equipment Elliptical   Recumbant Elliptical   Level 5   RPM 65   Watts 40   Minutes 40   Elliptical   Level 6   Speed 4   Minutes 45      Goals Met:  Independence with exercise equipment Exercise tolerated well Personal goals reviewed No report of cardiac concerns or symptoms Strength training completed today  Goals Unmet:   Not Applicable  Goals Comments: Patient completed exercise prescription and all exercise goals during rehab session. The exercise was tolerated well and the patient is progressing in the program.    Dr. Emily Filbert is Medical Director for [facility].

## 2015-10-28 ENCOUNTER — Encounter: Payer: BLUE CROSS/BLUE SHIELD | Admitting: *Deleted

## 2015-10-28 DIAGNOSIS — I2111 ST elevation (STEMI) myocardial infarction involving right coronary artery: Secondary | ICD-10-CM | POA: Diagnosis not present

## 2015-10-28 DIAGNOSIS — Z9861 Coronary angioplasty status: Secondary | ICD-10-CM

## 2015-10-28 NOTE — Progress Notes (Signed)
Daily Session Note  Patient Details  Name: Benjamin Carrillo MRN: [id number] Date of Birth: 1971/04/09 Referring Provider:  Shepard General, MD  Encounter Date: 10/28/2015  Check In:     Session Check In - 10/28/15 0819    Check-In   Location [facility]   Staff Present Candiss Norse, MS, ACSM CEP, Exercise Physiologist;Susanne Bice, RN, BSN, Laveda Norman, BS, ACSM CEP, Exercise Physiologist   Supervising physician immediately available to respond to emergencies See telemetry face sheet for immediately available ER MD   Medication changes reported     No   Fall or balance concerns reported    No   Warm-up and Cool-down Performed on first and last piece of equipment   Resistance Training Performed Yes   VAD Patient? No   Pain Assessment   Currently in Pain? No/denies   Multiple Pain Sites No         Goals Met:  Independence with exercise equipment Exercise tolerated well No report of cardiac concerns or symptoms Strength training completed today  Goals Unmet:  Not Applicable  Goals Comments: Patient completed exercise prescription and all exercise goals during rehab session. The exercise was tolerated well and the patient is progressing in the program.     Dr. Emily Filbert is Medical Director for [facility].

## 2015-10-30 ENCOUNTER — Encounter: Payer: BLUE CROSS/BLUE SHIELD | Admitting: *Deleted

## 2015-10-30 DIAGNOSIS — Z9861 Coronary angioplasty status: Secondary | ICD-10-CM

## 2015-10-30 DIAGNOSIS — I2111 ST elevation (STEMI) myocardial infarction involving right coronary artery: Secondary | ICD-10-CM | POA: Diagnosis not present

## 2015-10-30 NOTE — Progress Notes (Signed)
Daily Session Note  Patient Details  Name: Benjamin Carrillo MRN: [id number] Date of Birth: Jun 02, 1971 Referring Provider:  Martinique, Peter M, MD  Encounter Date: 10/30/2015  Check In:     Session Check In - 10/30/15 0932    Check-In   Location [facility]   Staff Present Carson Myrtle, BS, RRT, Respiratory Therapist;Disha Cottam Dillard Essex, MS, ACSM CEP, Exercise Physiologist;Susanne Bice, RN, BSN, CCRP   Supervising physician immediately available to respond to emergencies See telemetry face sheet for immediately available ER MD   Medication changes reported     No   Fall or balance concerns reported    No   Warm-up and Cool-down Performed on first and last piece of equipment   Resistance Training Performed Yes   VAD Patient? No   Pain Assessment   Currently in Pain? No/denies   Multiple Pain Sites No           Exercise Prescription Changes - 10/30/15 0900    Exercise Review   Progression Yes   Response to Exercise   Symptoms Had low BP post exercise and was feeling dizzy. Stayed after class 20 min and took in fluids and BP came up slighty and dizziness went away. Patient said he was well enoguh to leave   Comments Shayaan started jogging intervals on the TM and completed them with no signs or symptoms duing the exercise   Frequency Add 3 additional days to program exercise sessions.   Duration Progress to 50 minutes of aerobic without signs/symptoms of physical distress   Intensity Rest + 30   Progression Continue progressive overload as per policy without signs/symptoms or physical distress.   Resistance Training   Training Prescription Yes   Weight 10   Reps 10-15   Interval Training   Interval Training Yes   Equipment Treadmill;Elliptical   Comments TM: 3.7-4.5 w/15% grade. EL: L5-20   Treadmill   MPH 4.5   Grade 15   Minutes 45   Elliptical   Level 20   Speed 4   Minutes 45      Goals Met:  Independence with exercise equipment Exercise tolerated  well Personal goals reviewed No report of cardiac concerns or symptoms Strength training completed today  Goals Unmet:  BP ; low BP post exercise. Took in fluids and BP came up slightly and dizziness went away   Goals Comments: Patient completed exercise prescription and all exercise goals during rehab session. The exercise was tolerated well and the patient is progressing in the program.    Dr. Emily Filbert is Medical Director for [facility].

## 2015-11-01 ENCOUNTER — Encounter: Payer: BLUE CROSS/BLUE SHIELD | Admitting: *Deleted

## 2015-11-01 DIAGNOSIS — I2111 ST elevation (STEMI) myocardial infarction involving right coronary artery: Secondary | ICD-10-CM | POA: Diagnosis not present

## 2015-11-01 DIAGNOSIS — Z9861 Coronary angioplasty status: Secondary | ICD-10-CM

## 2015-11-01 NOTE — Progress Notes (Signed)
Daily Session Note  Patient Details  Name: Benjamin Carrillo MRN: [id number] Date of Birth: 05/27/1971 Referring Provider:  Martinique, Peter M, MD  Encounter Date: 11/01/2015  Check In:     Session Check In - 11/01/15 0857    Check-In   Location [facility]   Staff Present Gerlene Burdock, RN, Drusilla Kanner, MS, ACSM CEP, Exercise Physiologist;Susanne Bice, RN, BSN, CCRP   Supervising physician immediately available to respond to emergencies See telemetry face sheet for immediately available ER MD   Medication changes reported     No   Fall or balance concerns reported    No   Warm-up and Cool-down Performed on first and last piece of equipment   Resistance Training Performed Yes   VAD Patient? No   Pain Assessment   Currently in Pain? No/denies   Multiple Pain Sites No           Exercise Prescription Changes - 11/01/15 0800    Exercise Review   Progression Yes   Response to Exercise   Symptoms None   Comments A new target heart rate range for exercise was calculated based on the patient's ability to exercise with increased intensity. The range is 40-85% of HRR and encompasses moderate-vigorous exercise. The target heart rate range is equivalent to an RPE of 12-17. Elija's new target is 118-162bpm   Frequency Add 3 additional days to program exercise sessions.  Sanav is maintaining regular home exercise   Duration Progress to 50 minutes of aerobic without signs/symptoms of physical distress   Intensity Other (comment)  118-162 (40-85% HRR)   Progression Continue progressive overload as per policy without signs/symptoms or physical distress.   Resistance Training   Training Prescription Yes   Weight 10   Reps 10-15   Interval Training   Interval Training Yes   Equipment Treadmill;Elliptical   Comments TM: 3.7-4.5 w/15% grade. EL: L5-20   Treadmill   MPH 4.5   Grade 15   Minutes 45   Elliptical   Level 20   Speed 4   Minutes 45      Goals  Met:  Independence with exercise equipment Exercise tolerated well Personal goals reviewed No report of cardiac concerns or symptoms Strength training completed today  Goals Unmet:  Not Applicable  Goals Comments: Patient completed exercise prescription and all exercise goals during rehab session. The exercise was tolerated well and the patient is progressing in the program.    Dr. Emily Filbert is Medical Director for [facility].

## 2015-11-04 ENCOUNTER — Encounter: Payer: BLUE CROSS/BLUE SHIELD | Admitting: *Deleted

## 2015-11-04 DIAGNOSIS — I2111 ST elevation (STEMI) myocardial infarction involving right coronary artery: Secondary | ICD-10-CM | POA: Diagnosis not present

## 2015-11-04 DIAGNOSIS — Z9861 Coronary angioplasty status: Secondary | ICD-10-CM

## 2015-11-04 NOTE — Progress Notes (Signed)
Daily Session Note  Patient Details  Name: ERMINIO NYGARD MRN: [id number] Date of Birth: 1971/06/14 Referring Provider:  Martinique, Peter M, MD  Encounter Date: 11/04/2015  Check In:     Session Check In - 11/04/15 0817    Check-In   Location [facility]   Staff Present Candiss Norse, MS, ACSM CEP, Exercise Physiologist;Susanne Bice, RN, BSN, Laveda Norman, BS, ACSM CEP, Exercise Physiologist   Supervising physician immediately available to respond to emergencies See telemetry face sheet for immediately available ER MD   Medication changes reported     No   Fall or balance concerns reported    No   Warm-up and Cool-down Performed on first and last piece of equipment   Resistance Training Performed Yes   VAD Patient? No   Pain Assessment   Currently in Pain? No/denies   Multiple Pain Sites No         Goals Met:  Independence with exercise equipment Exercise tolerated well No report of cardiac concerns or symptoms Strength training completed today  Goals Unmet:  Not Applicable  Goals Comments: Patient completed exercise prescription and all exercise goals during rehab session. The exercise was tolerated well and the patient is progressing in the program.    Dr. Emily Filbert is Medical Director for [facility].

## 2015-11-05 ENCOUNTER — Encounter (HOSPITAL_COMMUNITY): Payer: BLUE CROSS/BLUE SHIELD

## 2015-11-05 NOTE — Progress Notes (Signed)
Cardiac Individual Treatment Plan  Patient Details  Name: Benjamin Carrillo MRN: [id number] Date of Birth: 01-23-1971 Referring Provider:  Martinique, Peter M, MD  Initial Encounter Date:    Visit Diagnosis: S/P PTCA (percutaneous transluminal coronary angioplasty)  Patient's Home Medications on Admission:  Current outpatient prescriptions:  .  ALPRAZolam (XANAX) 0.5 MG tablet, Take 0.5 mg by mouth daily as needed for anxiety., Disp: , Rfl:  .  aspirin 81 MG chewable tablet, Chew 1 tablet (81 mg total) by mouth daily., Disp: , Rfl:  .  atorvastatin (LIPITOR) 80 MG tablet, Take 1 tablet (80 mg total) by mouth daily at 6 PM., Disp: 30 tablet, Rfl: 6 .  calcium carbonate (TUMS - DOSED IN MG ELEMENTAL CALCIUM) 500 MG chewable tablet, Chew 1 tablet by mouth daily as needed for indigestion or heartburn., Disp: , Rfl:  .  metoprolol tartrate (LOPRESSOR) 25 MG tablet, Take 1 tablet (25 mg total) by mouth 2 (two) times daily., Disp: 180 tablet, Rfl: 1 .  nitroGLYCERIN (NITROSTAT) 0.4 MG SL tablet, Place 1 tablet (0.4 mg total) under the tongue every 5 (five) minutes as needed for chest pain (up to 3 doses)., Disp: 25 tablet, Rfl: 3 .  oxyCODONE (OXY IR/ROXICODONE) 5 MG immediate release tablet, Take 5 mg by mouth daily as needed. for pain, Disp: , Rfl: 0 .  ticagrelor (BRILINTA) 90 MG TABS tablet, Take 1 tablet (90 mg total) by mouth 2 (two) times daily., Disp: 60 tablet, Rfl: 11 .  triamcinolone cream (KENALOG) 0.1 %, Apply 1 application topically 2 (two) times daily as needed., Disp: , Rfl: 0  Past Medical History: Past Medical History  Diagnosis Date  . Pinched nerve     L4  . CAD (coronary artery disease)     a. 09/08/15: inferior STEMI s/p DES of mid RCA  . HLD (hyperlipidemia)     Tobacco Use: History  Smoking status  . Never Smoker   Smokeless tobacco  . Not on file    Labs: Recent Review Flowsheet Data    Labs for ITP Cardiac and Pulmonary Rehab Latest Ref Rng 09/08/2015 09/09/2015  10/22/2015   Cholestrol 125 - 200 mg/dL - 141 69(L)   LDLCALC <130 mg/dL - 89 28   HDL >=40 mg/dL - 27(L) 30(L)   Trlycerides <150 mg/dL - 126 55   Hemoglobin A1c 4.8 - 5.6 % 5.6 - -   TCO2 0 - 100 mmol/L 23 - -       Exercise Target Goals:    Exercise Program Goal: Individual exercise prescription set with THRR, safety & activity barriers. Participant demonstrates ability to understand and report RPE using BORG scale, to self-measure pulse accurately, and to acknowledge the importance of the exercise prescription.  Exercise Prescription Goal: Starting with aerobic activity 30 plus minutes a day, 3 days per week for initial exercise prescription. Provide home exercise prescription and guidelines that participant acknowledges understanding prior to discharge.  Activity Barriers & Risk Stratification:     Activity Barriers & Cardiac Risk Stratification - 09/25/15 1539    Activity Barriers & Cardiac Risk Stratification   Activity Barriers Back Problems   Cardiac Risk Stratification High      6 Minute Walk:     6 Minute Walk      09/25/15 1528       6 Minute Walk   Phase Initial     Distance 1330 feet     Walk Time 6 minutes     RPE  11     Symptoms Yes (comment)     Comments Some chest tightness     Resting HR 78 bpm     Resting BP 122/86 mmHg     Max Ex. HR 116 bpm     Max Ex. BP 144/84 mmHg        Initial Exercise Prescription:     Initial Exercise Prescription - 09/25/15 1500    Date of Initial Exercise Prescription   Date 09/25/15   Treadmill   MPH 2.5   Grade 0   Minutes 15   Bike   Level 0.4   Watts 15   Minutes 15   Recumbant Bike   Level 5   RPM 45   Watts 50   Minutes 15   NuStep   Level 4   Watts 50   Minutes 15   Arm Ergometer   Level 2   Watts 12   Minutes 10   Arm/Foot Ergometer   Level 4   Watts 15   Minutes 10   Cybex   Level 3   RPM 60   Minutes 15   Recumbant Elliptical   Level 2   RPM 45   Watts 25   Minutes 15    Elliptical   Level 1   Speed 3   Minutes 1   REL-XR   Level 3   Watts 45   Minutes 15   T5 Nustep   Level 3   Watts 25   Minutes 15   Biostep-RELP   Level 3   Watts 30   Minutes 15   Prescription Details   Frequency (times per week) 3   Duration Progress to 30 minutes of continuous aerobic without signs/symptoms of physical distress   Intensity   THRR REST +  30   Ratings of Perceived Exertion 11-15   Progression Continue progressive overload as per policy without signs/symptoms or physical distress.   Resistance Training   Training Prescription Yes   Weight 3   Reps 10-15      Exercise Prescription Changes:     Exercise Prescription Changes      10/01/15 0800 10/07/15 0800 10/09/15 0800 10/16/15 0900 10/23/15 0900   Exercise Review   Progression No  Has not started exercise, med review 09/25/15 Yes Yes Yes Yes   Response to Exercise   Symptoms  None None None None   Comments  Reviewed individualized exercise prescription and made increases per departmental policy. Exercise increases were discussed with the patient and they were able to perform the new work loads without issue (no signs or symptoms).    Increased [name] on the elliptical to further challenge and progress him in aerobic exercise. He can consistently exercise for the entire 45 min of aerobic exercise.   Frequency     Add 2 additional days to program exercise sessions.  Benjamin Carrillo is exercising at home on days he is not in HT   Duration  Progress to 50 minutes of aerobic without signs/symptoms of physical distress Progress to 50 minutes of aerobic without signs/symptoms of physical distress Progress to 50 minutes of aerobic without signs/symptoms of physical distress Progress to 50 minutes of aerobic without signs/symptoms of physical distress   Intensity  Rest + 30 Rest + 30 Rest + 30 Rest + 30   Progression  Continue progressive overload as per policy without signs/symptoms or physical distress. Continue  progressive overload as per policy without signs/symptoms or physical distress. Continue progressive  overload as per policy without signs/symptoms or physical distress. Continue progressive overload as per policy without signs/symptoms or physical distress.   Resistance Training   Training Prescription  Yes Yes Yes Yes   Weight  _0 Reps  10-15 10-15 10-15 10-15   Interval Training   Interval Training  No No Yes Yes   Equipment    Elliptical Elliptical   Recumbant Elliptical   Level  _1 RPM  40 65 65 65   Watts  40 40 40 40   Minutes  _2 40   Elliptical   Level  _3 Speed  _4 Minutes  _5 45     10/28/15 1100 10/30/15 0900 11/01/15 0800       Exercise Review   Progression Yes Yes Yes     Response to Exercise   Blood Pressure (Admit) 118/74 mmHg       Blood Pressure (Exercise) 168/80 mmHg       Blood Pressure (Exit) 104/62 mmHg       Heart Rate (Admit) 73 bpm       Heart Rate (Exercise) 115 bpm       Heart Rate (Exit) 97 bpm       Rating of Perceived Exertion (Exercise) 15       Symptoms None Had low BP post exercise and was feeling dizzy. Stayed after class 20 min and took in fluids and BP came up slighty and dizziness went away. Patient said he was well enoguh to leave None     Comments Ibraham does a great job of challenging himself and is now roatating between the TM and EL and using a 15% grade on the TM Travius started jogging intervals on the TM and completed them with no signs or symptoms duing the exercise A new target heart rate range for exercise was calculated based on the patient's ability to exercise with increased intensity. The range is 40-85% of HRR and encompasses moderate-vigorous exercise. The target heart rate range is equivalent to an RPE of 12-17. Kemo's new target is 118-162bpm     Frequency Add 3 additional days to program exercise sessions. Add 3 additional days to program exercise sessions. Add 3 additional days to program  exercise sessions.  Rhydian is maintaining regular home exercise     Duration Progress to 50 minutes of aerobic without signs/symptoms of physical distress Progress to 50 minutes of aerobic without signs/symptoms of physical distress Progress to 50 minutes of aerobic without signs/symptoms of physical distress     Intensity Rest + 30 Rest + 30 Other (comment)  118-162 (40-85% HRR)     Progression Continue progressive overload as per policy without signs/symptoms or physical distress. Continue progressive overload as per policy without signs/symptoms or physical distress. Continue progressive overload as per policy without signs/symptoms or physical distress.     Resistance Training   Training Prescription Yes Yes Yes     Weight _6 Reps 10-15 10-15 10-15     Interval Training   Interval Training Yes Yes Yes     Equipment Treadmill;Elliptical Treadmill;Elliptical Treadmill;Elliptical     Comments TM: 3.7-4.1 w/15% grade. EL: L5-20 TM: 3.7-4.5 w/15% grade. EL: L5-20 TM: 3.7-4.5 w/15% grade. EL: L5-20     Treadmill   MPH 4.1 4.5 4.5     Grade 15 15 15  Minutes 45 45 45     Elliptical   Level _0 Speed _1 Minutes 45 45 45        Discharge Exercise Prescription (Final Exercise Prescription Changes):     Exercise Prescription Changes - 11/01/15 0800    Exercise Review   Progression Yes   Response to Exercise   Symptoms None   Comments A new target heart rate range for exercise was calculated based on the patient's ability to exercise with increased intensity. The range is 40-85% of HRR and encompasses moderate-vigorous exercise. The target heart rate range is equivalent to an RPE of 12-17. Calvyn's new target is 118-162bpm   Frequency Add 3 additional days to program exercise sessions.  Shepherd is maintaining regular home exercise   Duration Progress to 50 minutes of aerobic without signs/symptoms of physical distress   Intensity Other (comment)  118-162 (40-85%  HRR)   Progression Continue progressive overload as per policy without signs/symptoms or physical distress.   Resistance Training   Training Prescription Yes   Weight 10   Reps 10-15   Interval Training   Interval Training Yes   Equipment Treadmill;Elliptical   Comments TM: 3.7-4.5 w/15% grade. EL: L5-20   Treadmill   MPH 4.5   Grade 15   Minutes 45   Elliptical   Level 20   Speed 4   Minutes 45      Nutrition:  Target Goals: Understanding of nutrition guidelines, daily intake of sodium <154m, cholesterol <2074m calories 30% from fat and 7% or less from saturated fats, daily to have 5 or more servings of fruits and vegetables.  Biometrics:     Pre Biometrics - 09/25/15 1527    Pre Biometrics   Height 5' 10.5" (1.791 m)   Weight 234 lb 8 oz (106.369 kg)   Waist Circumference 39 inches   Hip Circumference 41 inches   Waist to Hip Ratio 0.95 %   BMI (Calculated) 33.2       Nutrition Therapy Plan and Nutrition Goals:     Nutrition Therapy & Goals - 10/04/15 1152    Nutrition Therapy   Diet Instructed on a meal plan based on 2000 calories; DASH diet principles   Drug/Food Interactions Statins/Certain Fruits   Fiber 35 grams   Whole Grain Foods 3 servings   Protein 8 ounces/day   Saturated Fats 13 max. grams   Fruits and Vegetables 5 servings/day   Personal Nutrition Goals   Personal Goal #1 Increase fruit and vegetable intake to minimum of 5 servings per day.   Personal Goal #2 Include at least 30 grams carbohydrate per meal with preferred range of 45-60 gms.   Personal Goal #3 Read labels for saturated fat, trans fat and sodium.   Comments Patient is making positive changes in his diet. Diet is sometimes excessive in protein and low in healthy carbohydrates. He is also making lower sodium choices.Overall most of his food choices are low in saturated and trans fat.       Nutrition Discharge: Rate Your Plate Scores:     Rate Your Plate - 0196/43/8388184   Rate Your Plate Scores   Pre Score --  Patient will bring completed Rate Your Plate Questionnaire when he starts on January 16th.        Nutrition Goals Re-Evaluation:   Psychosocial: Target Goals: Acknowledge presence or absence of depression, maximize coping skills, provide positive support  system. Participant is able to verbalize types and ability to use techniques and skills needed for reducing stress and depression.  Initial Review & Psychosocial Screening:     Initial Psych Review & Screening - 09/25/15 1827    Initial Review   Current issues with Current Depression;Current Stress Concerns   Source of Stress Concerns Occupation   Comments Scored 13 on PHQ-9.  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  Sometimes patient feels he is depressed .  Patient takes Xanax prn for anxiety.     Family Dynamics   Good Support System? Yes  Patient has one wife and 3 children.  Is a member of a large [employer], [place].     Barriers   Psychosocial barriers to participate in program The patient should benefit from training in stress management and relaxation.   Screening Interventions   Interventions Encouraged to exercise;Program counselor consult      Quality of Life Scores:     Quality of Life - 09/25/15 1834    Quality of Life Scores   Health/Function Pre --  Patient will bring completed questionnaire when he starts Cardiac Rehab on January 16th.        PHQ-9:     Recent Review Flowsheet Data    Depression screen [facility] 2/9 09/25/2015   Decreased Interest 1   Down, Depressed, Hopeless 1   PHQ - 2 Score 2   Altered sleeping 3   Tired, decreased energy 3   Change in appetite 3   Feeling bad or failure about yourself  1   Trouble concentrating 0    Moving slowly or fidgety/restless 1   Suicidal thoughts 0   PHQ-9 Score 13   Difficult doing work/chores Somewhat difficult      Psychosocial Evaluation and Intervention:     Psychosocial Evaluation - 10/02/15 1026    Psychosocial Evaluation & Interventions   Interventions Stress management education;Relaxation education;Encouraged to exercise with the program and follow exercise prescription   Comments Counselor met with Mr. Schifano today for initial psychosocial evaluation.  He is a 45 year old who had a heart attack on "Christmas Day" and had a stent inserted.  He has a strong support system with a spouse of 16 years, adult children and parents close by, as well as active involvement in his local church.  Mr. Abril reports having intermittent sleep and is not taking anything currently for this.  He has a good appetite and denies a history of anxiety or depression.  Counselor discussed the PHQ-9 scores of 13 which indicate some possible depressive symptoms and he agreed he has a fear of losing his job over this health issue, so his mood is impacted.  Mr. 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  He has goals to learn his exercise limitations since the heart attack, and to increase his stamina and strength.  Counselor recommended Mr. Crabbe consult with his Dr. or pharmacist about use of a natural OTC to help with intermittent sleep concerns.  He is also recommended  to consider counseling to help with ways to manage stress and learn relaxation as well as help with transitions and job stress currently.  Counselor will follow with Mr. Siever as needed.   Continued Psychosocial Services Needed Yes  Mr. Law will benefit from psychosocial education on relaxation and stress management and consistent exercise.       Psychosocial Re-Evaluation:     Psychosocial Re-Evaluation      10/11/15 0919 10/21/15 0949         Psychosocial Re-Evaluation   Interventions Encouraged to attend Cardiac Rehabilitation for the exercise       Comments I asked Zeev what was decided about him returning back to work since [employer] can not return to work on a blood thinner. He said [place] has the same regulations for [employer] as [employer].  Follow up with Mr. Munford stating he is sleeping better now since taking a natural OTC sleep aid nightly.  He also reports feeling stronger since coming into this program.  His Dr. has ordered a baseline stress test tomorrow to help determine if Mr. 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  Mr. Chauncey Cruel is a little concerned about that and plans to do his best - no matter what.  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  So he has some mixed feelings about this considering what he has been through.  Counselor will continue to follow with him and encourages him to participate in the upcoming stress management component of this program.           Vocational Rehabilitation: Provide vocational rehab assistance to qualifying candidates.   Vocational Rehab Evaluation & Intervention:     Vocational Rehab - 09/25/15 1540    Initial Vocational Rehab Evaluation & Intervention   Assessment shows need for Vocational Rehabilitation No      Education: Education Goals: Education classes will be provided on a weekly basis, covering required topics. Participant will state understanding/return demonstration of topics presented.  Learning Barriers/Preferences:     Learning Barriers/Preferences - 09/25/15 1539    Learning Barriers/Preferences   Learning Barriers None   Learning Preferences Written Material;Group Instruction;Video      Education Topics: General Nutrition  Guidelines/Fats and Fiber: -Group instruction provided by verbal, written material, models and posters to present the general guidelines for heart healthy nutrition. Gives an explanation and review of dietary fats and fiber.          Cardiac Rehab from 11/04/2015 in [facility] Cardiac and Pulmonary Rehab   Date  10/21/15   Educator  CR   Instruction Review Code  2- meets goals/outcomes      Controlling Sodium/Reading Food Labels: -Group verbal and written material supporting the discussion of sodium use in heart healthy nutrition. Review and explanation with models, verbal and written materials for utilization of the food label.      Cardiac Rehab from 11/04/2015 in [facility] Cardiac and Pulmonary Rehab   Date  10/28/15   Educator  CR   Instruction Review Code  2- meets goals/outcomes      Exercise Physiology & Risk Factors: - Group verbal and written instruction with models to review the exercise physiology of the cardiovascular system and associated critical values. Details cardiovascular disease risk factors and the goals associated with each risk factor.   Aerobic Exercise & Resistance Training: - Gives group  verbal and written discussion on the health impact of inactivity. On the components of aerobic and resistive training programs and the benefits of this training and how to safely progress through these programs.   Flexibility, Balance, General Exercise Guidelines: - Provides group verbal and written instruction on the benefits of flexibility and balance training programs. Provides general exercise guidelines with specific guidelines to those with heart or lung disease. Demonstration and skill practice provided.   Stress Management: - Provides group verbal and written instruction about the health risks of elevated stress, cause of high stress, and healthy ways to reduce stress.   Depression: - Provides group verbal and written instruction on the correlation between heart/lung disease  and depressed mood, treatment options, and the stigmas associated with seeking treatment.   Anatomy & Physiology of the Heart: - Group verbal and written instruction and models provide basic cardiac anatomy and physiology, with the coronary electrical and arterial systems. Review of: AMI, Angina, Valve disease, Heart Failure, Cardiac Arrhythmia, Pacemakers, and the ICD.      Cardiac Rehab from 11/04/2015 in [facility] Cardiac and Pulmonary Rehab   Date  10/07/15   Educator  SB   Instruction Review Code  2- meets goals/outcomes      Cardiac Procedures: - Group verbal and written instruction and models to describe the testing methods done to diagnose heart disease. Reviews the outcomes of the test results. Describes the treatment choices: Medical Management, Angioplasty, or Coronary Bypass Surgery.      Cardiac Rehab from 11/04/2015 in [facility] Cardiac and Pulmonary Rehab   Date  11/04/15   Educator  SB   Instruction Review Code  2- meets goals/outcomes      Cardiac Medications: - Group verbal and written instruction to review commonly prescribed medications for heart disease. Reviews the medication, class of the drug, and side effects. Includes the steps to properly store meds and maintain the prescription regimen.      Cardiac Rehab from 11/04/2015 in [facility] Cardiac and Pulmonary Rehab   Date  10/23/15   Educator  SB   Instruction Review Code  2- meets goals/outcomes      Go Sex-Intimacy & Heart Disease, Get SMART - Goal Setting: - Group verbal and written instruction through game format to discuss heart disease and the return to sexual intimacy. Provides group verbal and written material to discuss and apply goal setting through the application of the S.M.A.R.T. Method.      Cardiac Rehab from 11/04/2015 in [facility] Cardiac and Pulmonary Rehab   Date  11/04/15   Educator  SB   Instruction Review Code  2- meets goals/outcomes      Other Matters of the Heart: - Provides group verbal, written  materials and models to describe Heart Failure, Angina, Valve Disease, and Diabetes in the realm of heart disease. Includes description of the disease process and treatment options available to the cardiac patient.      Cardiac Rehab from 11/04/2015 in [facility] Cardiac and Pulmonary Rehab   Date  10/14/15   Educator  SB   Instruction Review Code  2- meets goals/outcomes      Exercise & Equipment Safety: - Individual verbal instruction and demonstration of equipment use and safety with use of the equipment.      Cardiac Rehab from 11/04/2015 in [facility] Cardiac and Pulmonary Rehab   Date  09/25/15   Educator  DW   Instruction Review Code  1- partially meets, needs review/practice      Infection Prevention: -  Provides verbal and written material to individual with discussion of infection control including proper hand washing and proper equipment cleaning during exercise session.      Cardiac Rehab from 11/04/2015 in [facility] Cardiac and Pulmonary Rehab   Date  09/25/15   Educator  DW   Instruction Review Code  2- meets goals/outcomes      Falls Prevention: - Provides verbal and written material to individual with discussion of falls prevention and safety.      Cardiac Rehab from 11/04/2015 in [facility] Cardiac and Pulmonary Rehab   Date  09/25/15   Educator  DW   Instruction Review Code  2- meets goals/outcomes      Diabetes: - Individual verbal and written instruction to review signs/symptoms of diabetes, desired ranges of glucose level fasting, after meals and with exercise. Advice that pre and post exercise glucose checks will be done for 3 sessions at entry of program.    Knowledge Questionnaire Score:     Knowledge Questionnaire Score - 09/25/15 1540    Knowledge Questionnaire Score   Pre Score --  Will bring completed questionnaire on Monday, January 16th.        Personal Goals and Risk Factors at Admission:     Personal Goals and Risk Factors at Admission - 09/25/15 1823     Personal Goals and Risk Factors on Admission    Weight Management Yes   Intervention Learn and follow the exercise and diet guidelines while in the program. Utilize the nutrition and education classes to help gain knowledge of the diet and exercise expectations in the program   Admit Weight 234 lb 6.4 oz (106.323 kg)   Goal Weight 210 lb (95.255 kg)   Increase Aerobic Exercise and Physical Activity Yes   Intervention While in program, learn and follow the exercise prescription taught. Start at a low level workload and increase workload after able to maintain previous level for 30 minutes. Increase time before increasing intensity.   Understand more about Heart/Pulmonary Disease. Yes   Intervention While in program utilize professionals for any questions, and attend the education sessions. Great websites to use are www.americanheart.org or www.lung.org for reliable information.   Diabetes No   Hypertension Yes   Goal Participant will see blood pressure controlled within the values of 140/65m/Hg or within value directed by their physician.   Intervention Provide nutrition & aerobic exercise along with prescribed medications to achieve BP 140/90 or less.   Lipids Yes   Goal Cholesterol controlled with medications as prescribed, with individualized exercise RX and with personalized nutrition plan. Value goals: LDL < 775m HDL > 4085mParticipant states understanding of desired cholesterol values and following prescriptions.   Intervention Provide nutrition & aerobic exercise along with prescribed medications to achieve LDL <42m84mDL >40mg75mStress Yes   Goal To meet with psychosocial counselor for stress and relaxation information and guidance. To state understanding of performing relaxation techniques and or identifying personal stressors.   Intervention Provide education on types of stress, identifiying stressors, and ways to cope with stress. Provide demonstration and active practice of  relaxation techniques.      Personal Goals and Risk Factors Review:      Goals and Risk Factor Review      10/07/15 0833 10/28/15 1149         Increase Aerobic Exercise and Physical Activity   Goals Progress/Improvement seen  Yes Yes      Comments Tige Daymeonl has days where he feels low  energy and this is usually correlated with bad sleep the night before. However, he is still making gains in his aerobic endurance. He is younger and has a history of being physically active which and these factors contribute to his quick progress.  Juvencio is adpating to the exercise and can now consistently exercise at high workloads and high intensity. He is exercising for 45 minutes continuously for the aerobic period of class and is up to 10lb during weight training. 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 He has increased his workloads in order to prepare for this test. Right now using a high incline on the treadmill and high workload on the EL wil help increase leg strength for climbing stairs when he returns to work. We will check in with him in the coming weeks on more we can do duirng his time in rehab to prepare for his fitness test.          Personal Goals Discharge (Final Personal Goals and Risk Factors Review):      Goals and Risk Factor Review - 10/28/15 1149    Increase Aerobic Exercise and Physical Activity   Goals Progress/Improvement seen  Yes   Comments Amarian is adpating to the exercise and can now consistently exercise at high workloads and high intensity. He is exercising for 45 minutes continuously for the aerobic period of class and is up to 10lb during weight training. 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 He has increased his workloads in order to prepare for this test.  Right now using a high incline on the treadmill and high workload on the EL wil help increase leg strength for climbing stairs when he returns to work. We will check in with him in the coming weeks on more we can do duirng his time in rehab to prepare for his fitness test.       ITP Comments:     ITP Comments      10/10/15 1229 11/05/15 0814         ITP Comments Ready for 30 day review. Continue with ITP. 30 day review.  Continue with ITP. returning to work this week.         Comments:

## 2015-11-06 DIAGNOSIS — I2111 ST elevation (STEMI) myocardial infarction involving right coronary artery: Secondary | ICD-10-CM | POA: Diagnosis not present

## 2015-11-06 DIAGNOSIS — Z9861 Coronary angioplasty status: Secondary | ICD-10-CM

## 2015-11-06 NOTE — Progress Notes (Signed)
Daily Session Note  Patient Details  Name: Benjamin Carrillo MRN: [id number] Date of Birth: 04-17-1971 Referring Provider:  Shepard General, MD  Encounter Date: 11/06/2015  Check In:     Session Check In - 11/06/15 0821    Check-In   Location [facility]   Staff Present Heath Lark, RN, BSN, CCRP;Renee Dillard Essex, MS, ACSM CEP, Exercise Physiologist;Haskell Rihn, BS, ACSM EP-C, Exercise Physiologist   Supervising physician immediately available to respond to emergencies See telemetry face sheet for immediately available ER MD   Medication changes reported     No   Fall or balance concerns reported    No   Warm-up and Cool-down Performed on first and last piece of equipment   Resistance Training Performed No   VAD Patient? No   Pain Assessment   Currently in Pain? No/denies         Goals Met:  Proper associated with RPD/PD & O2 Sat Exercise tolerated well No report of cardiac concerns or symptoms Strength training completed today  Goals Unmet:  Not Applicable  Goals Comments:    Dr. Emily Filbert is Medical Director for [place] and [facility].

## 2015-11-08 ENCOUNTER — Encounter: Payer: BLUE CROSS/BLUE SHIELD | Admitting: *Deleted

## 2015-11-08 DIAGNOSIS — Z9861 Coronary angioplasty status: Secondary | ICD-10-CM

## 2015-11-08 DIAGNOSIS — I2111 ST elevation (STEMI) myocardial infarction involving right coronary artery: Secondary | ICD-10-CM

## 2015-11-08 NOTE — Progress Notes (Signed)
Daily Session Note  Patient Details  Name: Benjamin Carrillo MRN: [id number] Date of Birth: Mar 24, 1971 Referring Provider:  Martinique, Peter M, MD  Encounter Date: 11/08/2015  Check In:     Session Check In - 11/08/15 0919    Check-In   Staff Present Heath Lark, RN, BSN, CCRP;Renee Dillard Essex, MS, ACSM CEP, Exercise Physiologist;Carroll Enterkin, RN, BSN   Supervising physician immediately available to respond to emergencies See telemetry face sheet for immediately available ER MD   Medication changes reported     No   Fall or balance concerns reported    No   Warm-up and Cool-down Performed on first and last piece of equipment   Resistance Training Performed No   VAD Patient? No   Pain Assessment   Currently in Pain? No/denies         Goals Met:  Independence with exercise equipment Exercise tolerated well No report of cardiac concerns or symptoms Strength training completed today  Goals Unmet:  Not Applicable  Goals Comments: Doing well with exercise prescription progression.    Dr. Emily Filbert is Medical Director for [place] and [facility].

## 2015-11-11 ENCOUNTER — Encounter: Payer: BLUE CROSS/BLUE SHIELD | Admitting: *Deleted

## 2015-11-11 DIAGNOSIS — I2111 ST elevation (STEMI) myocardial infarction involving right coronary artery: Secondary | ICD-10-CM

## 2015-11-11 DIAGNOSIS — Z9861 Coronary angioplasty status: Secondary | ICD-10-CM

## 2015-11-11 NOTE — Progress Notes (Signed)
Daily Session Note  Patient Details  Name: Benjamin Carrillo MRN: [id number] Date of Birth: March 14, 1971 Referring Provider:  Martinique, Peter M, MD  Encounter Date: 11/11/2015  Check In:     Session Check In - 11/11/15 0954    Check-In   Staff Present Candiss Norse, MS, ACSM CEP, Exercise Physiologist;Rizwan Kuyper Amedeo Plenty, BS, ACSM CEP, Exercise Physiologist;Carroll Enterkin, RN, BSN   Supervising physician immediately available to respond to emergencies See telemetry face sheet for immediately available ER MD   Medication changes reported     No   Fall or balance concerns reported    No   Warm-up and Cool-down Performed on first and last piece of equipment   Resistance Training Performed Yes   VAD Patient? No   Pain Assessment   Currently in Pain? No/denies   Multiple Pain Sites No           Exercise Prescription Changes - 11/11/15 0900    Exercise Review   Progression Yes   Response to Exercise   Symptoms None   Comments Exercise intensity on the TM was increased. These increases were reviewed with the patient and he had no signs or symptoms associated with the increases. He tolerated his new workloads well.    Frequency Add 3 additional days to program exercise sessions.  Micha is maintaining regular home exercise   Duration Progress to 50 minutes of aerobic without signs/symptoms of physical distress   Intensity Other (comment)  118-162 (40-85% HRR)   Progression Continue progressive overload as per policy without signs/symptoms or physical distress.   Resistance Training   Training Prescription Yes   Weight 10   Reps 10-15   Interval Training   Interval Training Yes   Equipment Treadmill;Elliptical   Comments TM: 3.7-4.5 w/15% grade. EL: L5-20   Treadmill   MPH 6  3-6 mph/0-15% intervals   Grade 15   Minutes 40   Elliptical   Level 20   Speed 4   Minutes 45      Goals Met:  Independence with exercise equipment Exercise tolerated well Personal goals reviewed No  report of cardiac concerns or symptoms Strength training completed today  Goals Unmet:  Not Applicable  Goals Comments: Reviewed individualized exercise prescription and made increases per departmental policy. Exercise increases were discussed with the patient and they were able to perform the new work loads without issue (no signs or symptoms).     Dr. Emily Filbert is Medical Director for [place] and [facility].

## 2015-11-13 ENCOUNTER — Encounter: Payer: BLUE CROSS/BLUE SHIELD | Attending: Cardiology | Admitting: *Deleted

## 2015-11-13 DIAGNOSIS — Z9861 Coronary angioplasty status: Secondary | ICD-10-CM

## 2015-11-13 DIAGNOSIS — I2111 ST elevation (STEMI) myocardial infarction involving right coronary artery: Secondary | ICD-10-CM

## 2015-11-13 NOTE — Progress Notes (Signed)
Daily Session Note  Patient Details  Name: DRESHAUN STENE MRN: [id number] Date of Birth: 15-Jan-1971 Referring Provider:  Martinique, Peter M, MD  Encounter Date: 11/13/2015  Check In:     Session Check In - 11/13/15 0852    Check-In   Staff Present Nyoka Cowden, RN;Briant Angelillo, RN, BSN, CCRP;Steven Way, BS, ACSM EP-C, Exercise Physiologist   Supervising physician immediately available to respond to emergencies See telemetry face sheet for immediately available ER MD   Medication changes reported     No   Fall or balance concerns reported    No   Warm-up and Cool-down Performed on first and last piece of equipment   Resistance Training Performed No   VAD Patient? No   Pain Assessment   Currently in Pain? No/denies         Goals Met:  Independence with exercise equipment Exercise tolerated well No report of cardiac concerns or symptoms  Goals Unmet:  Not Applicable  Comments: Doing well with exercise prescription progression.    Dr. Emily Filbert is Medical Director for [place] and [facility].

## 2015-11-13 NOTE — Addendum Note (Signed)
Addended by: Lynford Humphrey on: 11/13/2015 07:21 AM   Modules accepted: Orders

## 2015-11-15 DIAGNOSIS — I2111 ST elevation (STEMI) myocardial infarction involving right coronary artery: Secondary | ICD-10-CM | POA: Diagnosis not present

## 2015-11-15 DIAGNOSIS — Z9861 Coronary angioplasty status: Secondary | ICD-10-CM

## 2015-11-15 NOTE — Progress Notes (Signed)
Daily Session Note  Patient Details  Name: Benjamin Carrillo MRN: [id number] Date of Birth: Oct 15, 1970 Referring Provider:  Martinique, Peter M, MD  Encounter Date: 11/15/2015  Check In:     Session Check In - 11/15/15 0942    Check-In   Location [facility]   Staff Present Jeanell Sparrow, PT;Susanne Bice, RN, BSN, CCRP;Carroll Enterkin, RN, BSN   Supervising physician immediately available to respond to emergencies See telemetry face sheet for immediately available ER MD   Medication changes reported     No   Fall or balance concerns reported    No   Warm-up and Cool-down Performed on first and last piece of equipment   Resistance Training Performed Yes   VAD Patient? No   Pain Assessment   Currently in Pain? No/denies         Goals Met:  Independence with exercise equipment Exercise tolerated well No report of cardiac concerns or symptoms Strength training completed today  Goals Unmet:  Not Applicable  Comments: Patient completed exercise prescription and all exercise goals during rehab session. The exercise was tolerated well and the patient is progressing in the program.   Dr. Emily Filbert is Medical Director for [place] and [facility].

## 2015-11-18 ENCOUNTER — Ambulatory Visit: Payer: BLUE CROSS/BLUE SHIELD | Admitting: Cardiology

## 2015-11-18 DIAGNOSIS — I2111 ST elevation (STEMI) myocardial infarction involving right coronary artery: Secondary | ICD-10-CM

## 2015-11-18 DIAGNOSIS — Z9861 Coronary angioplasty status: Secondary | ICD-10-CM

## 2015-11-18 NOTE — Progress Notes (Signed)
Daily Session Note  Patient Details  Name: MICHEL HENDON MRN: [id number] Date of Birth: [birth year] Referring Provider:  Martinique, Peter M, MD  Encounter Date: 11/18/2015  Check In:     Session Check In - 11/18/15 1124    Check-In   Location [facility]   Staff Present Heath Lark, RN, BSN, CCRP;Gradyn Shein, Sherril Cong, BS, ACSM CEP, Exercise Physiologist   Supervising physician immediately available to respond to emergencies See telemetry face sheet for immediately available ER MD   Medication changes reported     No   Fall or balance concerns reported    No   Warm-up and Cool-down Performed on first and last piece of equipment   Resistance Training Performed Yes   VAD Patient? No         Goals Met:  Independence with exercise equipment Exercise tolerated well No report of cardiac concerns or symptoms  Goals Unmet:  Not Applicable  Comments: Patient completed exercise prescription and all exercise goals during rehab session. The exercise was tolerated well and the patient is progressing in the program.    Dr. Emily Filbert is Medical Director for [place] and [facility].

## 2015-11-20 ENCOUNTER — Encounter: Payer: BLUE CROSS/BLUE SHIELD | Admitting: *Deleted

## 2015-11-20 DIAGNOSIS — I2111 ST elevation (STEMI) myocardial infarction involving right coronary artery: Secondary | ICD-10-CM | POA: Diagnosis not present

## 2015-11-20 DIAGNOSIS — Z9861 Coronary angioplasty status: Secondary | ICD-10-CM

## 2015-11-20 NOTE — Progress Notes (Signed)
Daily Session Note  Patient Details  Name: Benjamin Carrillo MRN: [id number] Date of Birth: 1971-02-13 Referring Provider:  Shepard General, MD  Encounter Date: 11/20/2015  Check In:     Session Check In - 11/20/15 0834    Check-In   Location [facility]   Staff Present Candiss Norse, MS, ACSM CEP, Exercise Physiologist;Susanne Bice, RN, BSN, CCRP;Steven Way, BS, ACSM EP-C, Exercise Physiologist   Supervising physician immediately available to respond to emergencies See telemetry face sheet for immediately available ER MD   Medication changes reported     No   Fall or balance concerns reported    No   Warm-up and Cool-down Performed on first and last piece of equipment   Resistance Training Performed No   VAD Patient? No   Pain Assessment   Currently in Pain? No/denies   Multiple Pain Sites No         Goals Met:  Independence with exercise equipment Exercise tolerated well Personal goals reviewed No report of cardiac concerns or symptoms Strength training completed today  Goals Unmet:  Not Applicable  Comments: Patient completed exercise prescription and all exercise goals during rehab session. The exercise was tolerated well and the patient is progressing in the program.   Dr. Emily Filbert is Medical Director for [place] and [facility].

## 2015-11-22 ENCOUNTER — Encounter: Payer: BLUE CROSS/BLUE SHIELD | Admitting: *Deleted

## 2015-11-22 VITALS — Ht 70.5 in | Wt 225.0 lb

## 2015-11-22 DIAGNOSIS — I2111 ST elevation (STEMI) myocardial infarction involving right coronary artery: Secondary | ICD-10-CM | POA: Diagnosis not present

## 2015-11-22 DIAGNOSIS — Z9861 Coronary angioplasty status: Secondary | ICD-10-CM

## 2015-11-22 NOTE — Patient Instructions (Signed)
Discharge Instructions  Patient Details  Name: Benjamin Carrillo MRN: [id number] Date of Birth: 19-Aug-1971 Referring Provider:  Martinique, Peter M, MD   Number of Visits: 86 today will complete 34 on Monday   Reason for Discharge:  Patient reached a stable level of exercise. Patient independent in their exercise.  Smoking History:  History  Smoking status  . Never Smoker   Smokeless tobacco  . Not on file    Diagnosis:  S/P PTCA (percutaneous transluminal coronary angioplasty)  Initial Exercise Prescription:     Initial Exercise Prescription - 09/25/15 1500    Date of Initial Exercise Prescription   Date 09/25/15   Treadmill   MPH 2.5   Grade 0   Minutes 15   Bike   Level 0.4   Watts 15   Minutes 15   Recumbant Bike   Level 5   RPM 45   Watts 50   Minutes 15   NuStep   Level 4   Watts 50   Minutes 15   Arm Ergometer   Level 2   Watts 12   Minutes 10   Arm/Foot Ergometer   Level 4   Watts 15   Minutes 10   Cybex   Level 3   RPM 60   Minutes 15   Recumbant Elliptical   Level 2   RPM 45   Watts 25   Minutes 15   Elliptical   Level 1   Speed 3   Minutes 1   REL-XR   Level 3   Watts 45   Minutes 15   T5 Nustep   Level 3   Watts 25   Minutes 15   Biostep-RELP   Level 3   Watts 30   Minutes 15   Prescription Details   Frequency (times per week) 3   Duration Progress to 30 minutes of continuous aerobic without signs/symptoms of physical distress   Intensity   THRR REST +  30   Ratings of Perceived Exertion 11-15   Progression   Progression Continue progressive overload as per policy without signs/symptoms or physical distress.   Resistance Training   Training Prescription Yes   Weight 3   Reps 10-15      Discharge Exercise Prescription (Final Exercise Prescription Changes):     Exercise Prescription Changes - 11/22/15 0900    Exercise Review   Progression Yes   Response to Exercise   Symptoms None   Comments Patient is  approaching graduation of the program and home exercise plans were discussed. Details of the patient's exercise prescription and what they need to do in order to continue the prescription and progress with exercise were outlined and the patient verbalized understanding. The patient plans to complete all exercise at home and at his work gym doing resistance training circuits and using the treadmill and Probation officer.    Frequency Add 3 additional days to program exercise sessions.  [name] is maintaining regular home exercise   Duration Progress to 50 minutes of aerobic without signs/symptoms of physical distress   Intensity Other (comment)  118-162 (40-85% HRR)   Progression   Progression Continue progressive overload as per policy without signs/symptoms or physical distress.   Resistance Training   Training Prescription Yes   Weight 10   Reps 10-15   Interval Training   Interval Training Yes   Equipment Treadmill;Elliptical   Comments TM: 3.7-4.5 w/15% grade. EL: L5-20   Treadmill   MPH 6  Jogging intervals  up to 72mh and 15% incline   Grade 15   Minutes 40   Elliptical   Level 20   Speed 4   Minutes 45   Speed (read-only) 4   Home Exercise Plan   Plans to continue exercise at Home  Also at his work gym      Functional Capacity:     6 Minute Walk      09/25/15 1528 11/22/15 0935     6 Minute Walk   Phase Initial Discharge    Distance 1330 feet 1600 feet    Distance % Change  20 %    Walk Time 6 minutes 6 minutes    # of Rest Breaks  0    RPE 11 12    Symptoms Yes (comment) No    Comments Some chest tightness     Resting HR 78 bpm 74 bpm    Resting BP 122/86 mmHg 122/80 mmHg    Max Ex. HR 116 bpm 108 bpm    Max Ex. BP 144/84 mmHg 132/78 mmHg       Quality of Life:     Quality of Life - 11/22/15 1030    Quality of Life Scores   Health/Function Post 26.4 %   Socioeconomic Post 27.43 %   Psych/Spiritual Post 25.71 %   Family Post 30 %   GLOBAL Post 27 %       Personal Goals: Goals established at orientation with interventions provided to work toward goal.     Personal Goals and Risk Factors at Admission - 09/25/15 1823    Core Components/Risk Factors/Patient Goals on Admission    Weight Management Yes   Intervention (read-only) Learn and follow the exercise and diet guidelines while in the program. Utilize the nutrition and education classes to help gain knowledge of the diet and exercise expectations in the program   Admit Weight 234 lb 6.4 oz (106.323 kg)   Goal Weight: Short Term 210 lb (95.255 kg)   Sedentary Yes   Intervention (read-only) While in program, learn and follow the exercise prescription taught. Start at a low level workload and increase workload after able to maintain previous level for 30 minutes. Increase time before increasing intensity.   Diabetes No   Hypertension Yes   Goal Participant will see blood pressure controlled within the values of 140/932mHg or within value directed by their physician.   Intervention (read-only) Provide nutrition & aerobic exercise along with prescribed medications to achieve BP 140/90 or less.   Lipids Yes   Goal Cholesterol controlled with medications as prescribed, with individualized exercise RX and with personalized nutrition plan. Value goals: LDL < 7080mHDL > 60m10marticipant states understanding of desired cholesterol values and following prescriptions.   Intervention (read-only) Provide nutrition & aerobic exercise along with prescribed medications to achieve LDL <70mg32mL >60mg.90mtress Yes   Goal To meet with psychosocial counselor for stress and relaxation information and guidance. To state understanding of performing relaxation techniques and or identifying personal stressors.   Intervention (read-only) Provide education on types of stress, identifiying stressors, and ways to cope with stress. Provide demonstration and active practice of relaxation techniques.   Understand  more about Heart/Pulmonary Disease. Yes   Intervention While in program utilize professionals for any questions, and attend the education sessions. Great websites to use are www.americanheart.org or www.lung.org for reliable information.       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    Expected Outcomes Stockton 's goal is to drop 2 more lbs. 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 .       Nutrition & Weight - Outcomes:     Pre Biometrics - 09/25/15 1527    Pre Biometrics   Height 5' 10.5" (1.791 m)   Weight 234 lb 8 oz (106.369 kg)   Waist Circumference 39 inches   Hip Circumference 41 inches   Waist to Hip Ratio 0.95 %   BMI (Calculated) 33.2         Post Biometrics - 11/22/15 0938     Post  Biometrics   Height 5' 10.5" (1.791 m)   Weight 225 lb (102.059 kg)   Waist Circumference 39 inches   Hip Circumference 39.5 inches   Waist to Hip Ratio 0.99 %   BMI (Calculated) 31.9      Nutrition:     Nutrition Therapy & Goals - 10/04/15 1152    Nutrition Therapy   Diet Instructed on a meal plan based on 2000 calories; DASH diet principles   Drug/Food Interactions Statins/Certain Fruits   Protein (oz) (read-only) 8 ounces/day   Fiber 35 grams   Whole Grain Foods 3 servings   Saturated Fats 13 max. grams   Fruits and Vegetables 5 servings/day   Personal Nutrition Goals   Personal Goal #1 Increase fruit and vegetable intake to minimum of 5 servings per day.   Personal Goal #2 Include at least 30 grams carbohydrate per meal with preferred range of 45-60 gms.   Personal Goal #3 Read labels for saturated fat, trans fat and sodium.   Comments Patient is making positive changes in his diet. Diet is sometimes  excessive in protein and low in healthy carbohydrates. He is also making lower sodium choices.Overall most of his food choices are low in saturated and trans fat.       Nutrition Discharge:     Nutrition Assessments - 11/22/15 1029    Rate Your Plate Scores   Post Score 78   Post Score % 86.6 %      Education Questionnaire Score:     Knowledge Questionnaire Score - 11/22/15 1032    Knowledge Questionnaire Score   Post Score 24      Goals reviewed with patient; copy given to patient.

## 2015-11-22 NOTE — Progress Notes (Signed)
Cardiac Individual Treatment Plan  Patient Details  Name: Benjamin Carrillo MRN: [id number] Date of Birth: Apr 23, 1971 Referring Provider:  Martinique, Peter M, MD  Initial Encounter Date:    Visit Diagnosis: S/P PTCA (percutaneous transluminal coronary angioplasty)  Patient's Home Medications on Admission:  Current outpatient prescriptions:  .  ALPRAZolam (XANAX) 0.5 MG tablet, Take 0.5 mg by mouth daily as needed for anxiety., Disp: , Rfl:  .  aspirin 81 MG chewable tablet, Chew 1 tablet (81 mg total) by mouth daily., Disp: , Rfl:  .  atorvastatin (LIPITOR) 80 MG tablet, Take 1 tablet (80 mg total) by mouth daily at 6 PM., Disp: 30 tablet, Rfl: 6 .  calcium carbonate (TUMS - DOSED IN MG ELEMENTAL CALCIUM) 500 MG chewable tablet, Chew 1 tablet by mouth daily as needed for indigestion or heartburn., Disp: , Rfl:  .  metoprolol tartrate (LOPRESSOR) 25 MG tablet, Take 1 tablet (25 mg total) by mouth 2 (two) times daily., Disp: 180 tablet, Rfl: 1 .  nitroGLYCERIN (NITROSTAT) 0.4 MG SL tablet, Place 1 tablet (0.4 mg total) under the tongue every 5 (five) minutes as needed for chest pain (up to 3 doses)., Disp: 25 tablet, Rfl: 3 .  oxyCODONE (OXY IR/ROXICODONE) 5 MG immediate release tablet, Take 5 mg by mouth daily as needed. for pain, Disp: , Rfl: 0 .  ticagrelor (BRILINTA) 90 MG TABS tablet, Take 1 tablet (90 mg total) by mouth 2 (two) times daily., Disp: 60 tablet, Rfl: 11 .  triamcinolone cream (KENALOG) 0.1 %, Apply 1 application topically 2 (two) times daily as needed., Disp: , Rfl: 0  Past Medical History: Past Medical History  Diagnosis Date  . Pinched nerve     L4  . CAD (coronary artery disease)     a. 09/08/15: inferior STEMI s/p DES of mid RCA  . HLD (hyperlipidemia)     Tobacco Use: History  Smoking status  . Never Smoker   Smokeless tobacco  . Not on file    Labs: Recent Review Flowsheet Data    Labs for ITP Cardiac and Pulmonary Rehab Latest Ref Rng 09/08/2015 09/09/2015  10/22/2015   Cholestrol 125 - 200 mg/dL - 141 69(L)   LDLCALC <130 mg/dL - 89 28   HDL >=40 mg/dL - 27(L) 30(L)   Trlycerides <150 mg/dL - 126 55   Hemoglobin A1c 4.8 - 5.6 % 5.6 - -   TCO2 0 - 100 mmol/L 23 - -       Exercise Target Goals:    Exercise Program Goal: Individual exercise prescription set with THRR, safety & activity barriers. Participant demonstrates ability to understand and report RPE using BORG scale, to self-measure pulse accurately, and to acknowledge the importance of the exercise prescription.  Exercise Prescription Goal: Starting with aerobic activity 30 plus minutes a day, 3 days per week for initial exercise prescription. Provide home exercise prescription and guidelines that participant acknowledges understanding prior to discharge.  Activity Barriers & Risk Stratification:     Activity Barriers & Cardiac Risk Stratification - 09/25/15 1539    Activity Barriers & Cardiac Risk Stratification   Activity Barriers Back Problems   Cardiac Risk Stratification High      6 Minute Walk:     6 Minute Walk      09/25/15 1528 11/22/15 0935     6 Minute Walk   Phase Initial Discharge    Distance 1330 feet 1600 feet    Distance % Change  20 %  Walk Time 6 minutes 6 minutes    # of Rest Breaks  0    RPE 11 12    Symptoms Yes (comment) No    Comments Some chest tightness     Resting HR 78 bpm 74 bpm    Resting BP 122/86 mmHg 122/80 mmHg    Max Ex. HR 116 bpm 108 bpm    Max Ex. BP 144/84 mmHg 132/78 mmHg       Initial Exercise Prescription:     Initial Exercise Prescription - 09/25/15 1500    Date of Initial Exercise Prescription   Date 09/25/15   Treadmill   MPH 2.5   Grade 0   Minutes 15   Bike   Level 0.4   Watts 15   Minutes 15   Recumbant Bike   Level 5   RPM 45   Watts 50   Minutes 15   NuStep   Level 4   Watts 50   Minutes 15   Arm Ergometer   Level 2   Watts 12   Minutes 10   Arm/Foot Ergometer   Level 4   Watts 15    Minutes 10   Cybex   Level 3   RPM 60   Minutes 15   Recumbant Elliptical   Level 2   RPM 45   Watts 25   Minutes 15   Elliptical   Level 1   Speed 3   Minutes 1   REL-XR   Level 3   Watts 45   Minutes 15   T5 Nustep   Level 3   Watts 25   Minutes 15   Biostep-RELP   Level 3   Watts 30   Minutes 15   Prescription Details   Frequency (times per week) 3   Duration Progress to 30 minutes of continuous aerobic without signs/symptoms of physical distress   Intensity   THRR REST +  30   Ratings of Perceived Exertion 11-15   Progression   Progression Continue progressive overload as per policy without signs/symptoms or physical distress.   Resistance Training   Training Prescription Yes   Weight 3   Reps 10-15      Exercise Prescription Changes:     Exercise Prescription Changes      10/01/15 0800 10/07/15 0800 10/09/15 0800 10/16/15 0900 10/23/15 0900   Exercise Review   Progression No  Has not started exercise, med review 09/25/15 Yes Yes Yes Yes   Response to Exercise   Symptoms  None None None None   Comments  Reviewed individualized exercise prescription and made increases per departmental policy. Exercise increases were discussed with the patient and they were able to perform the new work loads without issue (no signs or symptoms).    Increased Datron on the elliptical to further challenge and progress him in aerobic exercise. He can consistently exercise for the entire 45 min of aerobic exercise.   Frequency     Add 2 additional days to program exercise sessions.  Benjamin Carrillo is exercising at home on days he is not in HT   Duration  Progress to 50 minutes of aerobic without signs/symptoms of physical distress Progress to 50 minutes of aerobic without signs/symptoms of physical distress Progress to 50 minutes of aerobic without signs/symptoms of physical distress Progress to 50 minutes of aerobic without signs/symptoms of physical distress   Intensity  Rest + 30 Rest + 30  Rest + 30 Rest + 30  Progression   Progression  Continue progressive overload as per policy without signs/symptoms or physical distress. Continue progressive overload as per policy without signs/symptoms or physical distress. Continue progressive overload as per policy without signs/symptoms or physical distress. Continue progressive overload as per policy without signs/symptoms or physical distress.   Resistance Training   Training Prescription (read-only)  Yes Yes Yes Yes   Weight (read-only)  _0 Reps (read-only)  10-15 10-15 10-15 10-15   Interval Training   Interval Training  No No Yes Yes   Equipment    Elliptical Elliptical   Recumbant Elliptical   Level (read-only)  _1 RPM (read-only)  40 65 65 65   Watts (read-only)  40 40 40 40   Minutes (read-only)  _2 40   Elliptical   Level (read-only)  _3 Speed (read-only)  _4 Minutes (read-only)  _5 45     10/28/15 1100 10/30/15 0900 11/01/15 0800 11/11/15 0900 11/22/15 0900   Exercise Review   Progression _6    Response to Exercise   Blood Pressure (Admit) 118/74 mmHg       Blood Pressure (Exercise) 168/80 mmHg       Blood Pressure (Exit) 104/62 mmHg       Heart Rate (Admit) 73 bpm       Heart Rate (Exercise) 115 bpm       Heart Rate (Exit) 97 bpm       Rating of Perceived Exertion (Exercise) 15       Symptoms None Had low BP post exercise and was feeling dizzy. Stayed after class 20 min and took in fluids and BP came up slighty and dizziness went away. Patient said he was well enoguh to leave None None None   Comments Suhaas does a great job of challenging himself and is now roatating between the TM and EL and using a 15% grade on the TM Limuel started jogging intervals on the TM and completed them with no signs or symptoms duing the exercise A new target heart rate range for exercise was calculated based on the patient's ability to exercise with increased intensity. The range is  40-85% of HRR and encompasses moderate-vigorous exercise. The target heart rate range is equivalent to an RPE of 12-17. Anacleto's new target is 118-162bpm Exercise intensity on the TM was increased. These increases were reviewed with the patient and he had no signs or symptoms associated with the increases. He tolerated his new workloads well.  Patient is approaching graduation of the program and home exercise plans were discussed. Details of the patient's exercise prescription and what they need to do in order to continue the prescription and progress with exercise were outlined and the patient verbalized understanding. The patient plans to complete all exercise at home and at his work gym doing resistance training circuits and using the treadmill and Probation officer.    Frequency Add 3 additional days to program exercise sessions. Add 3 additional days to program exercise sessions. Add 3 additional days to program exercise sessions.  Jacaden is maintaining regular home exercise Add 3 additional days to program exercise sessions.  Gad is maintaining regular home exercise Add 3 additional days to program exercise sessions.  Gerado is maintaining regular home exercise   Duration Progress to 50 minutes of aerobic without signs/symptoms of physical distress Progress to 50 minutes of  aerobic without signs/symptoms of physical distress Progress to 50 minutes of aerobic without signs/symptoms of physical distress Progress to 50 minutes of aerobic without signs/symptoms of physical distress Progress to 50 minutes of aerobic without signs/symptoms of physical distress   Intensity Rest + 30 Rest + 30 Other (comment)  118-162 (40-85% HRR) Other (comment)  118-162 (40-85% HRR) Other (comment)  118-162 (40-85% HRR)   Progression   Progression Continue progressive overload as per policy without signs/symptoms or physical distress. Continue progressive overload as per policy without signs/symptoms or physical distress. Continue  progressive overload as per policy without signs/symptoms or physical distress. Continue progressive overload as per policy without signs/symptoms or physical distress. Continue progressive overload as per policy without signs/symptoms or physical distress.   Resistance Training   Training Prescription     Yes   Weight     10   Reps     10-15   Training Prescription (read-only) Yes Yes Yes Yes    Weight (read-only) _0 Reps (read-only) 10-15 10-15 10-15 10-15    Interval Training   Interval Training _1    Equipment Treadmill;Elliptical Treadmill;Elliptical Treadmill;Elliptical Treadmill;Elliptical Treadmill;Elliptical   Comments TM: 3.7-4.1 w/15% grade. EL: L5-20 TM: 3.7-4.5 w/15% grade. EL: L5-20 TM: 3.7-4.5 w/15% grade. EL: L5-20 TM: 3.7-4.5 w/15% grade. EL: L5-20 TM: 3.7-4.5 w/15% grade. EL: L5-20   Treadmill   MPH     6  Jogging intervals up to 18mh and 15% incline   Grade     15   Minutes     434  MPH (read-only) 4.1 4.5 4.5 6  3-6 mph/0-15% intervals    Grade (read-only) _2 Minutes (read-only) 45 45 45 40    Elliptical   Level     20   Speed     4   Minutes     45   Level (read-only) _3 Speed (read-only) _4 Minutes (read-only) 45 45 45 45    Home Exercise Plan   Plans to continue exercise at     Home  Also at his work gym      Discharge Exercise Prescription (Final Exercise Prescription Changes):     Exercise Prescription Changes - 11/22/15 0900    Exercise Review   Progression Yes   Response to Exercise   Symptoms None   Comments Patient is approaching graduation of the program and home exercise plans were discussed. Details of the patient's exercise prescription and what they need to do in order to continue the prescription and progress with exercise were outlined and the patient verbalized understanding. The patient plans to complete all exercise at home and at his work gym doing resistance training  circuits and using the treadmill and sProbation officer    Frequency Add 3 additional days to program exercise sessions.  DRodericis maintaining regular home exercise   Duration Progress to 50 minutes of aerobic without signs/symptoms of physical distress   Intensity Other (comment)  118-162 (40-85% HRR)   Progression   Progression Continue progressive overload as per policy without signs/symptoms or physical distress.   Resistance Training   Training Prescription Yes   Weight 10   Reps 10-15   Interval Training   Interval Training Yes   Equipment Treadmill;Elliptical   Comments TM: 3.7-4.5 w/15% grade. EL: L5-20   Treadmill   MPH 6  Jogging intervals up to 57mh and 15% incline   Grade 15   Minutes 40   Elliptical   Level 20   Speed 4   Minutes 45   Speed (read-only) 4   Home Exercise Plan   Plans to continue exercise at Home  Also at his work gym      Nutrition:  Target Goals: Understanding of nutrition guidelines, daily intake of sodium <15056m cholesterol <2002mcalories 30% from fat and 7% or less from saturated fats, daily to have 5 or more servings of fruits and vegetables.  Biometrics:     Pre Biometrics - 09/25/15 1527    Pre Biometrics   Height 5' 10.5" (1.791 Carrillo)   Weight 234 lb 8 oz (106.369 kg)   Waist Circumference 39 inches   Hip Circumference 41 inches   Waist to Hip Ratio 0.95 %   BMI (Calculated) 33.2         Post Biometrics - 11/22/15 0938     Post  Biometrics   Height 5' 10.5" (1.791 Carrillo)   Weight 225 lb (102.059 kg)   Waist Circumference 39 inches   Hip Circumference 39.5 inches   Waist to Hip Ratio 0.99 %   BMI (Calculated) 31.9      Nutrition Therapy Plan and Nutrition Goals:     Nutrition Therapy & Goals - 10/04/15 1152    Nutrition Therapy   Diet Instructed on a meal plan based on 2000 calories; DASH diet principles   Drug/Food Interactions Statins/Certain Fruits   Protein (oz) (read-only) 8 ounces/day   Fiber 35 grams   Whole Grain  Foods 3 servings   Saturated Fats 13 max. grams   Fruits and Vegetables 5 servings/day   Personal Nutrition Goals   Personal Goal #1 Increase fruit and vegetable intake to minimum of 5 servings per day.   Personal Goal #2 Include at least 30 grams carbohydrate per meal with preferred range of 45-60 gms.   Personal Goal #3 Read labels for saturated fat, trans fat and sodium.   Comments Patient is making positive changes in his diet. Diet is sometimes excessive in protein and low in healthy carbohydrates. He is also making lower sodium choices.Overall most of his food choices are low in saturated and trans fat.       Nutrition Discharge: Rate Your Plate Scores:     Nutrition Assessments - 11/22/15 1029    Rate Your Plate Scores   Post Score 78   Post Score % 86.6 %      Nutrition Goals Re-Evaluation:     Nutrition Goals Re-Evaluation      11/22/15 1036           Personal Goal #1 Re-Evaluation   Goal Progress Seen Yes       Comments DalRutilio working on this.        Personal Goal #3 Re-Evaluation   Goal Progress Seen Yes       Comments He and his wife are reading labels more.           Psychosocial: Target Goals: Acknowledge presence or absence of depression, maximize coping skills, provide positive support system. Participant is able to verbalize types and ability to use techniques and skills needed for reducing stress and depression.  Initial Review & Psychosocial Screening:     Initial Psych Review & Screening - 09/25/15 1827    Initial Review   Current issues with Current Depression;Current Stress Concerns   Source of Stress Concerns  Occupation   Comments Scored 13 on PHQ-9.  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  Sometimes patient feels he is  depressed .  Patient takes Xanax prn for anxiety.     Family Dynamics   Good Support System? Yes  Patient has one wife and 3 children.  Is a member of a large [employer], [place].     Barriers   Psychosocial barriers to participate in program The patient should benefit from training in stress management and relaxation.   Screening Interventions   Interventions Encouraged to exercise;Program counselor consult      Quality of Life Scores:     Quality of Life - 11/22/15 1030    Quality of Life Scores   Health/Function Post 26.4 %   Socioeconomic Post 27.43 %   Psych/Spiritual Post 25.71 %   Family Post 30 %   GLOBAL Post 27 %      PHQ-9:     Recent Review Flowsheet Data    Depression screen [facility] 2/9 11/22/2015 09/25/2015   Decreased Interest 1 1   Down, Depressed, Hopeless 0 1   PHQ - 2 Score 1 2   Altered sleeping 1 3   Tired, decreased energy 2 3   Change in appetite 1 3   Feeling bad or failure about yourself  0 1   Trouble concentrating 1 0   Moving slowly or fidgety/restless 0 1   Suicidal thoughts 0 0   PHQ-9 Score 6 13   Difficult doing work/chores Somewhat difficult Somewhat difficult      Psychosocial Evaluation and Intervention:     Psychosocial Evaluation - 10/02/15 1026    Psychosocial Evaluation & Interventions   Interventions Stress management education;Relaxation education;Encouraged to exercise with the program and follow exercise prescription   Comments Counselor met with Mr. Flye today for initial psychosocial evaluation.  He is a 45 year old who had a heart attack on "Christmas Day" and had a stent inserted.  He has a strong support system with a spouse of 16 years, adult children and parents close by, as well as active involvement in his local church.  Mr. Moening reports having intermittent sleep and is not taking anything currently for this.  He has a good appetite and denies a history of anxiety or depression.  Counselor discussed the PHQ-9  scores of 13 which indicate some possible depressive symptoms and he agreed he has a fear of losing his job over this health issue, so his mood is impacted.  Mr. 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  He has goals to learn his exercise limitations since the heart attack, and to increase his stamina and strength.  Counselor recommended Mr. Prevette consult with his Dr. or pharmacist about use of a natural OTC to help with intermittent sleep concerns.  He is also recommended to consider counseling to help with ways to manage stress and learn relaxation as well as help with transitions and job stress currently.  Counselor will follow with Mr. Goshert as needed.  Continued Psychosocial Services Needed Yes  Mr. Mayabb will benefit from psychosocial education on relaxation and stress management and consistent exercise.      Psychosocial Re-Evaluation:     Psychosocial Re-Evaluation      10/11/15 0919 10/21/15 0949 11/22/15 1032       Psychosocial Re-Evaluation   Interventions Encouraged to attend Cardiac Rehabilitation for the exercise       Comments I asked Lin what was decided about him returning back to work since [employer] can not return to work on a blood thinner. He said [place] has the same regulations for [employer] as [employer].  Follow up with Mr. Snodgrass stating he is sleeping better now since taking a natural OTC sleep aid nightly.  He also reports feeling stronger since coming into this program.  His Dr. has ordered a baseline stress test tomorrow to help determine if Mr. 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  Mr. Chauncey Cruel is a little concerned about that and plans to do his best - no matter what.  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  So he has some mixed feelings about this considering what he has been through.  Counselor will continue to follow with him and encourages him to participate in the upcoming stress management component of this program.   Dequandre reports he has tried several times to call to schedule his test but has not gotten up with them.         Vocational Rehabilitation: Provide vocational rehab assistance to qualifying candidates.   Vocational Rehab Evaluation & Intervention:     Vocational Rehab - 09/25/15 1540    Initial Vocational Rehab Evaluation & Intervention   Assessment shows need for Vocational Rehabilitation No      Education: Education Goals: Education classes will be provided on a weekly basis, covering required topics. Participant will state understanding/return demonstration of topics presented.  Learning Barriers/Preferences:     Learning Barriers/Preferences - 09/25/15 1539    Learning Barriers/Preferences   Learning Barriers None   Learning Preferences Written Material;Group Instruction;Video      Education Topics: General Nutrition Guidelines/Fats and Fiber: -Group instruction provided by verbal, written material, models and posters to present the general guidelines for heart healthy nutrition. Gives an explanation and review of dietary fats and fiber.          Cardiac Rehab from 11/20/2015 in [facility] Cardiac and Pulmonary Rehab   Date  10/21/15   Educator  CR   Instruction Review Code  2- meets goals/outcomes      Controlling Sodium/Reading Food Labels: -Group verbal and written material supporting the discussion of sodium use in heart healthy nutrition. Review and explanation with models, verbal and written materials for utilization of the food label.      Cardiac Rehab from 11/20/2015 in [facility] Cardiac and Pulmonary Rehab   Date  10/28/15   Educator  CR   Instruction Review Code  2- meets goals/outcomes      Exercise  Physiology & Risk Factors: - Group verbal and written instruction with models to review the exercise physiology of the cardiovascular system and associated critical values. Details cardiovascular disease risk factors and the goals associated with each risk factor.      Cardiac Rehab from 11/20/2015 in [facility] Cardiac and Pulmonary Rehab   Date  11/11/15   Educator  RM   Instruction Review Code  2- meets goals/outcomes      Aerobic Exercise & Resistance Training: - Gives group verbal and written discussion on the health impact of inactivity. On the components of aerobic and resistive training programs and the benefits of this training and how to safely progress through these programs.      Cardiac Rehab from 11/20/2015 in [facility] Cardiac and Pulmonary Rehab   Date  11/13/15   Educator  SW   Instruction Review Code  2- meets goals/outcomes      Flexibility, Balance, General Exercise Guidelines: - Provides group verbal and written instruction on the benefits of flexibility and balance training programs. Provides general exercise guidelines with specific guidelines to those with heart or lung disease. Demonstration and skill practice provided.      Cardiac Rehab from 11/20/2015 in [facility] Cardiac and Pulmonary Rehab   Date  11/18/15   Educator  [facility]   Instruction Review Code  2- meets goals/outcomes      Stress Management: - Provides group verbal and written instruction about the health risks of elevated stress, cause of high stress, and healthy ways to reduce stress.      Cardiac Rehab from 11/20/2015 in [facility] Cardiac and Pulmonary Rehab   Date  11/20/15   Educator  [facility]   Instruction Review Code  2- meets goals/outcomes      Depression: - Provides group verbal and written instruction on the correlation between heart/lung disease and depressed mood, treatment options, and the stigmas associated with seeking treatment.      Cardiac Rehab from 11/20/2015 in [facility] Cardiac and Pulmonary Rehab   Date  11/06/15    Educator  [facility]   Instruction Review Code  2- meets goals/outcomes      Anatomy & Physiology of the Heart: - Group verbal and written instruction and models provide basic cardiac anatomy and physiology, with the coronary electrical and arterial systems. Review of: AMI, Angina, Valve disease, Heart Failure, Cardiac Arrhythmia, Pacemakers, and the ICD.      Cardiac Rehab from 11/20/2015 in [facility] Cardiac and Pulmonary Rehab   Date  10/07/15   Educator  SB   Instruction Review Code  2- meets goals/outcomes      Cardiac Procedures: - Group verbal and written instruction and models to describe the testing methods done to diagnose heart disease. Reviews the outcomes of the test results. Describes the treatment choices: Medical Management, Angioplasty, or Coronary Bypass Surgery.      Cardiac Rehab from 11/20/2015 in [facility] Cardiac and Pulmonary Rehab   Date  11/04/15   Educator  SB   Instruction Review Code  2- meets goals/outcomes      Cardiac Medications: - Group verbal and written instruction to review commonly prescribed medications for heart disease. Reviews the medication, class of the drug, and side effects. Includes the steps to properly store meds and maintain the prescription regimen.      Cardiac Rehab from 11/20/2015 in [facility] Cardiac and Pulmonary Rehab   Date  10/23/15   Educator  SB   Instruction Review Code  2- meets goals/outcomes      Go Sex-Intimacy & Heart Disease, Get SMART - Goal Setting: - Group verbal and written instruction through game format to discuss heart disease and the return to sexual intimacy. Provides group verbal and written material to discuss and apply goal setting through the application of the S.Carrillo.A.R.T. Method.      Cardiac Rehab from 11/20/2015 in [facility] Cardiac and  Pulmonary Rehab   Date  11/04/15   Educator  SB   Instruction Review Code  2- meets goals/outcomes      Other Matters of the Heart: - Provides group verbal, written materials and models to  describe Heart Failure, Angina, Valve Disease, and Diabetes in the realm of heart disease. Includes description of the disease process and treatment options available to the cardiac patient.      Cardiac Rehab from 11/20/2015 in [facility] Cardiac and Pulmonary Rehab   Date  10/14/15   Educator  SB   Instruction Review Code  2- meets goals/outcomes      Exercise & Equipment Safety: - Individual verbal instruction and demonstration of equipment use and safety with use of the equipment.      Cardiac Rehab from 11/20/2015 in [facility] Cardiac and Pulmonary Rehab   Date  09/25/15   Educator  DW   Instruction Review Code  1- partially meets, needs review/practice      Infection Prevention: - Provides verbal and written material to individual with discussion of infection control including proper hand washing and proper equipment cleaning during exercise session.      Cardiac Rehab from 11/20/2015 in [facility] Cardiac and Pulmonary Rehab   Date  09/25/15   Educator  DW   Instruction Review Code  2- meets goals/outcomes      Falls Prevention: - Provides verbal and written material to individual with discussion of falls prevention and safety.      Cardiac Rehab from 11/20/2015 in [facility] Cardiac and Pulmonary Rehab   Date  09/25/15   Educator  DW   Instruction Review Code  2- meets goals/outcomes      Diabetes: - Individual verbal and written instruction to review signs/symptoms of diabetes, desired ranges of glucose level fasting, after meals and with exercise. Advice that pre and post exercise glucose checks will be done for 3 sessions at entry of program.    Knowledge Questionnaire Score:     Knowledge Questionnaire Score - 11/22/15 1032    Knowledge Questionnaire Score   Post Score 24      Personal Goals and Risk Factors at Admission:     Personal Goals and Risk Factors at Admission - 09/25/15 1823    Core Components/Risk Factors/Patient Goals on Admission    Weight Management Yes    Intervention (read-only) Learn and follow the exercise and diet guidelines while in the program. Utilize the nutrition and education classes to help gain knowledge of the diet and exercise expectations in the program   Admit Weight 234 lb 6.4 oz (106.323 kg)   Goal Weight: Short Term 210 lb (95.255 kg)   Sedentary Yes   Intervention (read-only) While in program, learn and follow the exercise prescription taught. Start at a low level workload and increase workload after able to maintain previous level for 30 minutes. Increase time before increasing intensity.   Diabetes No   Hypertension Yes   Goal Participant will see blood pressure controlled within the values of 140/45m/Hg or within value directed by their physician.   Intervention (read-only) Provide nutrition & aerobic exercise along with prescribed medications to achieve BP 140/90 or less.   Lipids Yes   Goal Cholesterol controlled with medications as prescribed, with individualized exercise RX and with personalized nutrition plan. Value goals: LDL < 764m HDL > 4057mParticipant states understanding of desired cholesterol values and following prescriptions.   Intervention (read-only) Provide nutrition & aerobic exercise along with prescribed medications to achieve LDL <  49m, HDL >4104m   Stress Yes   Goal To meet with psychosocial counselor for stress and relaxation information and guidance. To state understanding of performing relaxation techniques and or identifying personal stressors.   Intervention (read-only) Provide education on types of stress, identifiying stressors, and ways to cope with stress. Provide demonstration and active practice of relaxation techniques.   Understand more about Heart/Pulmonary Disease. Yes   Intervention While in program utilize professionals for any questions, and attend the education sessions. Great websites to use are www.americanheart.org or www.lung.org for reliable information.      Personal Goals  and Risk Factors Review:      Goals and Risk Factor Review      10/07/15 [id number]/[id number]/17 [id number] 11/22/15 1037       Core Components/Risk Factors/Patient Goals Review   Personal Goals Review Increase Aerobic Exercise and Physical Activity  Weight Management/Obesity;Hypertension;Lipids;Stress     Review   DaAmarios down from 234lbs to 225lbs.. 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      Expected Outcomes   DaGunnisons goal is to drop 2 more lbs. 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 .      Increase Aerobic Exercise and Physical Activity (read-only)   Goals Progress/Improvement seen  Yes Yes      Comments DaDelshontill has days where he feels low energy and this is usually correlated with bad sleep the night before. However, he is still making gains in his aerobic endurance. He is younger and has a history of being physically active which and these factors contribute to his quick progress.  DaWhalens adpating to the exercise and can now consistently exercise at high workloads and high intensity. He is exercising for 45 minutes continuously for the aerobic period of class and is up to 10lb during weight training. 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 He has increased his workloads in order to prepare for this test. Right now using a high incline on the treadmill and high workload on the EL wil help increase leg strength for climbing stairs when he returns to work. We will check in with him in the coming weeks on more we can do duirng his time in rehab to prepare for his fitness test.          Personal Goals Discharge (Final Personal Goals and Risk Factors Review):      Goals and Risk Factor Review - 11/22/15 1037    Core Components/Risk Factors/Patient Goals Review   Personal Goals Review Weight Management/Obesity;Hypertension;Lipids;Stress   Review DaSalils down from 234lbs to  225lbs.. 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    Expected Outcomes DaJuandaniels goal is to drop 2 more lbs. 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 .       ITP Comments:     ITP Comments      10/10/15 1229 11/05/15 0814 11/08/15 0920       ITP Comments Ready for 30 day review. Continue with ITP. 30 day review.  Continue with ITP. returning to work this week. Andrue was tired today. He has returned to work light duty. 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         Comments:

## 2015-11-22 NOTE — Progress Notes (Signed)
Daily Session Note  Patient Details  Name: Benjamin Carrillo MRN: [id number] Date of Birth: [birth year] Referring Provider:  Martinique, Peter M, MD  Encounter Date: 11/22/2015  Check In:     Session Check In - 11/22/15 0934    Check-In   Location [facility]-Cardiac & Pulmonary Rehab   Staff Present Benjamin Norse, MS, ACSM CEP, Exercise Physiologist;Benjamin Enterkin, RN, BSN;Benjamin Carrillo Media, RRT, RCP, Respiratory Therapist   Supervising physician immediately available to respond to emergencies See telemetry face sheet for immediately available ER MD   Medication changes reported     No   Fall or balance concerns reported    No   Warm-up and Cool-down Performed on first and last piece of equipment   Resistance Training Performed Yes   VAD Patient? No   Pain Assessment   Currently in Pain? No/denies   Multiple Pain Sites No           Exercise Prescription Changes - 11/22/15 0900    Exercise Review   Progression Yes   Response to Exercise   Symptoms None   Comments Patient is approaching graduation of the program and home exercise plans were discussed. Details of the patient's exercise prescription and what they need to do in order to continue the prescription and progress with exercise were outlined and the patient verbalized understanding. The patient plans to complete all exercise at home and at his work gym doing resistance training circuits and using the treadmill and Probation officer.    Frequency Add 3 additional days to program exercise sessions.  Benjamin Carrillo is maintaining regular home exercise   Duration Progress to 50 minutes of aerobic without signs/symptoms of physical distress   Intensity Other (comment)  118-162 (40-85% HRR)   Progression   Progression Continue progressive overload as per policy without signs/symptoms or physical distress.   Resistance Training   Training Prescription Yes   Weight 10   Reps 10-15   Interval Training   Interval Training Yes   Equipment  Treadmill;Elliptical   Comments TM: 3.7-4.5 w/15% grade. EL: L5-20   Treadmill   MPH 6  Jogging intervals up to 76mh and 15% incline   Grade 15   Minutes 40   Elliptical   Level 20   Speed 4   Minutes 45   Speed (read-only) 4   Home Exercise Plan   Plans to continue exercise at Home  Also at his work gym      Goals Met:  Independence with exercise equipment Exercise tolerated well Personal goals reviewed No report of cardiac concerns or symptoms Strength training completed today  Goals Unmet:  Not Applicable  Comments: Patient completed exercise prescription and all exercise goals during rehab session. The exercise was tolerated well and the patient is progressing in the program.    Dr. MEmily Filbertis Medical Director for [facility].

## 2015-11-25 ENCOUNTER — Encounter: Payer: BLUE CROSS/BLUE SHIELD | Admitting: *Deleted

## 2015-11-25 DIAGNOSIS — I2111 ST elevation (STEMI) myocardial infarction involving right coronary artery: Secondary | ICD-10-CM | POA: Diagnosis not present

## 2015-11-25 DIAGNOSIS — Z9861 Coronary angioplasty status: Secondary | ICD-10-CM

## 2015-11-25 NOTE — Progress Notes (Signed)
Discharge Summary  Patient Details  Name: Benjamin Carrillo MRN: [id number] Date of Birth: 1970/12/12 Referring Provider:  Martinique, Peter M, MD   Number of Visits: 36  Reason for Discharge:  Patient reached a stable level of exercise. Patient independent in their exercise.  Smoking History:  History  Smoking status  . Never Smoker   Smokeless tobacco  . Not on file    Diagnosis:  S/P PTCA (percutaneous transluminal coronary angioplasty)  ST elevation myocardial infarction involving right coronary artery (HCC)  ADL UCSD:   Initial Exercise Prescription:     Initial Exercise Prescription - 09/25/15 1500    Date of Initial Exercise Prescription   Date 09/25/15   Treadmill   MPH 2.5   Grade 0   Minutes 15   Bike   Level 0.4   Watts 15   Minutes 15   Recumbant Bike   Level 5   RPM 45   Watts 50   Minutes 15   NuStep   Level 4   Watts 50   Minutes 15   Arm Ergometer   Level 2   Watts 12   Minutes 10   Arm/Foot Ergometer   Level 4   Watts 15   Minutes 10   Cybex   Level 3   RPM 60   Minutes 15   Recumbant Elliptical   Level 2   RPM 45   Watts 25   Minutes 15   Elliptical   Level 1   Speed 3   Minutes 1   REL-XR   Level 3   Watts 45   Minutes 15   T5 Nustep   Level 3   Watts 25   Minutes 15   Biostep-RELP   Level 3   Watts 30   Minutes 15   Prescription Details   Frequency (times per week) 3   Duration Progress to 30 minutes of continuous aerobic without signs/symptoms of physical distress   Intensity   THRR REST +  30   Ratings of Perceived Exertion 11-15   Progression   Progression Continue progressive overload as per policy without signs/symptoms or physical distress.   Resistance Training   Training Prescription Yes   Weight 3   Reps 10-15      Discharge Exercise Prescription (Final Exercise Prescription Changes):     Exercise Prescription Changes - 11/22/15 0900    Exercise Review   Progression Yes   Response to  Exercise   Symptoms None   Comments Patient is approaching graduation of the program and home exercise plans were discussed. Details of the patient's exercise prescription and what they need to do in order to continue the prescription and progress with exercise were outlined and the patient verbalized understanding. The patient plans to complete all exercise at home and at his work gym doing resistance training circuits and using the treadmill and Probation officer.    Frequency Add 3 additional days to program exercise sessions.  [name] is maintaining regular home exercise   Duration Progress to 50 minutes of aerobic without signs/symptoms of physical distress   Intensity Other (comment)  118-162 (40-85% HRR)   Progression   Progression Continue progressive overload as per policy without signs/symptoms or physical distress.   Resistance Training   Training Prescription Yes   Weight 10   Reps 10-15   Interval Training   Interval Training Yes   Equipment Treadmill;Elliptical   Comments TM: 3.7-4.5 w/15% grade. EL: L5-20   Treadmill  MPH 6  Jogging intervals up to 6mph and 15% incline   Grade 15   Minutes 40   Elliptical   Level 20   Speed 4   Minutes 45   Speed (read-only) 4   Home Exercise Plan   Plans to continue exercise at Home  Also at his work gym      Functional Capacity:     6 Minute Walk      09/25/15 1528 11/22/15 0935     6 Minute Walk   Phase Initial Discharge    Distance 1330 feet 1600 feet    Distance % Change  20 %    Walk Time 6 minutes 6 minutes    # of Rest Breaks  0    RPE 11 12    Symptoms Yes (comment) No    Comments Some chest tightness     Resting HR 78 bpm 74 bpm    Resting BP 122/86 mmHg 122/80 mmHg    Max Ex. HR 116 bpm 108 bpm    Max Ex. BP 144/84 mmHg 132/78 mmHg       Psychological, QOL, Others - Outcomes: PHQ 2/9: Depression screen PHQ 2/9 11/22/2015 09/25/2015  Decreased Interest 1 1  Down, Depressed, Hopeless 0 1  PHQ - 2 Score 1 2   Altered sleeping 1 3  Tired, decreased energy 2 3  Change in appetite 1 3  Feeling bad or failure about yourself  0 1  Trouble concentrating 1 0  Moving slowly or fidgety/restless 0 1  Suicidal thoughts 0 0  PHQ-9 Score 6 13  Difficult doing work/chores Somewhat difficult Somewhat difficult    Quality of Life:     Quality of Life - 11/22/15 1030    Quality of Life Scores   Health/Function Post 26.4 %   Socioeconomic Post 27.43 %   Psych/Spiritual Post 25.71 %   Family Post 30 %   GLOBAL Post 27 %      Personal Goals: Goals established at orientation with interventions provided to work toward goal.     Personal Goals and Risk Factors at Admission - 09/25/15 1823    Core Components/Risk Factors/Patient Goals on Admission    Weight Management Yes   Intervention (read-only) Learn and follow the exercise and diet guidelines while in the program. Utilize the nutrition and education classes to help gain knowledge of the diet and exercise expectations in the program   Admit Weight 234 lb 6.4 oz (106.323 kg)   Goal Weight: Short Term 210 lb (95.255 kg)   Sedentary Yes   Intervention (read-only) While in program, learn and follow the exercise prescription taught. Start at a low level workload and increase workload after able to maintain previous level for 30 minutes. Increase time before increasing intensity.   Diabetes No   Hypertension Yes   Goal Participant will see blood pressure controlled within the values of 140/90mm/Hg or within value directed by their physician.   Intervention (read-only) Provide nutrition & aerobic exercise along with prescribed medications to achieve BP 140/90 or less.   Lipids Yes   Goal Cholesterol controlled with medications as prescribed, with individualized exercise RX and with personalized nutrition plan. Value goals: LDL < 70mg, HDL > 40mg. Participant states understanding of desired cholesterol values and following prescriptions.   Intervention  (read-only) Provide nutrition & aerobic exercise along with prescribed medications to achieve LDL <70mg, HDL >40mg.   Stress Yes   Goal To meet with psychosocial counselor for stress   and relaxation information and guidance. To state understanding of performing relaxation techniques and or identifying personal stressors.   Intervention (read-only) Provide education on types of stress, identifiying stressors, and ways to cope with stress. Provide demonstration and active practice of relaxation techniques.   Understand more about Heart/Pulmonary Disease. Yes   Intervention While in program utilize professionals for any questions, and attend the education sessions. Great websites to use are www.americanheart.org or www.lung.org for reliable information.       Personal Goals Discharge:     Goals and Risk Factor Review      10/07/15 0833 10/28/15 1149 11/22/15 1037       Core Components/Risk Factors/Patient Goals Review   Personal Goals Review Increase Aerobic Exercise and Physical Activity  Weight Management/Obesity;Hypertension;Lipids;Stress     Review   Terrell is down from 234lbs to 225lbs.. 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      Expected Outcomes   Seabron 's goal is to drop 2 more lbs. 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 .      Increase Aerobic Exercise and Physical Activity (read-only)   Goals Progress/Improvement seen  Yes Yes      Comments Danzig still has days where he feels low energy and this is usually correlated with bad sleep the night before. However, he is still making gains in his aerobic endurance. He is younger and has a history of being physically active which and these factors contribute to his quick progress.  Abdulraheem is adpating to the exercise and can now consistently exercise at high workloads and high intensity. He is exercising for 45 minutes continuously for the aerobic period of class and is up to 10lb during weight training. 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 He has increased his workloads in order to prepare for this test. Right now using a high incline on the treadmill and high workload on the EL wil help increase leg strength for climbing stairs when he returns to work. We will check in with him in the coming weeks on more we can do duirng his time in rehab to prepare for his fitness test.          Nutrition & Weight - Outcomes:     Pre Biometrics - 09/25/15 1527    Pre Biometrics   Height 5' 10.5" (1.791 Carrillo)   Weight 234 lb 8 oz (106.369 kg)   Waist Circumference 39 inches   Hip Circumference 41 inches   Waist to Hip Ratio 0.95 %   BMI (Calculated) 33.2         Post Biometrics - 11/22/15 0938     Post  Biometrics   Height 5' 10.5" (1.791 Carrillo)   Weight 225 lb (102.059 kg)   Waist Circumference 39 inches   Hip Circumference 39.5 inches   Waist to Hip Ratio 0.99 %   BMI (Calculated) 31.9      Nutrition:     Nutrition Therapy & Goals - 10/04/15 1152    Nutrition Therapy   Diet Instructed on a meal plan based on 2000 calories; DASH diet principles   Drug/Food Interactions Statins/Certain Fruits   Protein (oz) (read-only) 8 ounces/day   Fiber 35 grams   Whole Grain Foods 3 servings   Saturated Fats 13 max. grams   Fruits and Vegetables 5 servings/day     Personal Nutrition Goals   Personal Goal #1 Increase fruit and vegetable intake to minimum of 5 servings per day.   Personal Goal #2 Include at least 30 grams carbohydrate per meal with preferred range of 45-60 gms.   Personal Goal #3 Read labels for saturated fat, trans fat and sodium.   Comments Patient is making positive changes in his diet. Diet is sometimes excessive in protein and low in healthy carbohydrates. He is also making lower sodium choices.Overall most of his food choices are low in saturated and trans fat.       Nutrition Discharge:      Nutrition Assessments - 11/22/15 1029    Rate Your Plate Scores   Post Score 78   Post Score % 86.6 %      Education Questionnaire Score:     Knowledge Questionnaire Score - 11/22/15 1032    Knowledge Questionnaire Score   Post Score 24      Goals reviewed with patient; copy given to patient.

## 2015-11-25 NOTE — Progress Notes (Signed)
Daily Session Note  Patient Details  Name: Benjamin Carrillo MRN: [id number] Date of Birth: November 21, 1970 Referring Provider:  Martinique, Peter M, MD  Encounter Date: 11/25/2015  Check In:     Session Check In - 11/25/15 0816    Check-In   Location [facility]-Cardiac & Pulmonary Rehab   Staff Present Heath Lark, RN, BSN, Laveda Norman, BS, ACSM CEP, Exercise Physiologist;Renee Dillard Essex, MS, ACSM CEP, Exercise Physiologist   Supervising physician immediately available to respond to emergencies See telemetry face sheet for immediately available ER MD   Medication changes reported     No   Fall or balance concerns reported    No   Warm-up and Cool-down Performed on first and last piece of equipment   Resistance Training Performed Yes   VAD Patient? No   Pain Assessment   Currently in Pain? No/denies   Multiple Pain Sites No         Goals Met:  Independence with exercise equipment Exercise tolerated well Personal goals reviewed No report of cardiac concerns or symptoms Strength training completed today  Goals Unmet:  Not Applicable  Comments: Patient completed exercise prescription and all exercise goals during rehab session. The exercise was tolerated well and the patient is progressing in the program. Today is the patient's final day of class. He plans to continue home exercise.    Dr. Emily Filbert is Medical Director for [place] and [facility].

## 2015-11-25 NOTE — Patient Instructions (Signed)
Discharge Instructions  Patient Details  Name: Benjamin Carrillo MRN: [id number] Date of Birth: 1970-11-19 Referring Provider:  [place], Peter M, MD   Number of Visits: 36  Reason for Discharge:  Patient reached a stable level of exercise. Patient independent in their exercise.  Smoking History:  History  Smoking status  . Never Smoker   Smokeless tobacco  . Not on file    Diagnosis:  S/P PTCA (percutaneous transluminal coronary angioplasty)  ST elevation myocardial infarction involving right coronary artery [facility])  Initial Exercise Prescription:     Initial Exercise Prescription - 09/25/15 1500    Date of Initial Exercise Prescription   Date 09/25/15   Treadmill   MPH 2.5   Grade 0   Minutes 15   Bike   Level 0.4   Watts 15   Minutes 15   Recumbant Bike   Level 5   RPM 45   Watts 50   Minutes 15   NuStep   Level 4   Watts 50   Minutes 15   Arm Ergometer   Level 2   Watts 12   Minutes 10   Arm/Foot Ergometer   Level 4   Watts 15   Minutes 10   Cybex   Level 3   RPM 60   Minutes 15   Recumbant Elliptical   Level 2   RPM 45   Watts 25   Minutes 15   Elliptical   Level 1   Speed 3   Minutes 1   REL-XR   Level 3   Watts 45   Minutes 15   T5 Nustep   Level 3   Watts 25   Minutes 15   Biostep-RELP   Level 3   Watts 30   Minutes 15   Prescription Details   Frequency (times per week) 3   Duration Progress to 30 minutes of continuous aerobic without signs/symptoms of physical distress   Intensity   THRR REST +  30   Ratings of Perceived Exertion 11-15   Progression   Progression Continue progressive overload as per policy without signs/symptoms or physical distress.   Resistance Training   Training Prescription Yes   Weight 3   Reps 10-15      Discharge Exercise Prescription (Final Exercise Prescription Changes):     Exercise Prescription Changes - 11/22/15 0900    Exercise Review   Progression Yes   Response to Exercise   Symptoms None   Comments Patient is approaching graduation of the program and home exercise plans were discussed. Details of the patient's exercise prescription and what they need to do in order to continue the prescription and progress with exercise were outlined and the patient verbalized understanding. The patient plans to complete all exercise at home and at his work gym doing resistance training circuits and using the treadmill and Probation officer.    Frequency Add 3 additional days to program exercise sessions.  Benjamin Carrillo is maintaining regular home exercise   Duration Progress to 50 minutes of aerobic without signs/symptoms of physical distress   Intensity Other (comment)  118-162 (40-85% HRR)   Progression   Progression Continue progressive overload as per policy without signs/symptoms or physical distress.   Resistance Training   Training Prescription Yes   Weight 10   Reps 10-15   Interval Training   Interval Training Yes   Equipment Treadmill;Elliptical   Comments TM: 3.7-4.5 w/15% grade. EL: L5-20   Treadmill   MPH 6  Jogging intervals up to 52mh and 15% incline   Grade 15   Minutes 40   Elliptical   Level 20   Speed 4   Minutes 45   Speed (read-only) 4   Home Exercise Plan   Plans to continue exercise at Home  Also at his work gym      Functional Capacity:     6 Minute Walk      09/25/15 1528 11/22/15 0935     6 Minute Walk   Phase Initial Discharge    Distance 1330 feet 1600 feet    Distance % Change  20 %    Walk Time 6 minutes 6 minutes    # of Rest Breaks  0    RPE 11 12    Symptoms Yes (comment) No    Comments Some chest tightness     Resting HR 78 bpm 74 bpm    Resting BP 122/86 mmHg 122/80 mmHg    Max Ex. HR 116 bpm 108 bpm    Max Ex. BP 144/84 mmHg 132/78 mmHg       Quality of Life:     Quality of Life - 11/22/15 1030    Quality of Life Scores   Health/Function Post 26.4 %   Socioeconomic Post 27.43 %   Psych/Spiritual Post 25.71 %   Family  Post 30 %   GLOBAL Post 27 %      Personal Goals: Goals established at orientation with interventions provided to work toward goal.     Personal Goals and Risk Factors at Admission - 09/25/15 1823    Core Components/Risk Factors/Patient Goals on Admission    Weight Management Yes   Intervention (read-only) Learn and follow the exercise and diet guidelines while in the program. Utilize the nutrition and education classes to help gain knowledge of the diet and exercise expectations in the program   Admit Weight 234 lb 6.4 oz (106.323 kg)   Goal Weight: Short Term 210 lb (95.255 kg)   Sedentary Yes   Intervention (read-only) While in program, learn and follow the exercise prescription taught. Start at a low level workload and increase workload after able to maintain previous level for 30 minutes. Increase time before increasing intensity.   Diabetes No   Hypertension Yes   Goal Participant will see blood pressure controlled within the values of 140/96mHg or within value directed by their physician.   Intervention (read-only) Provide nutrition & aerobic exercise along with prescribed medications to achieve BP 140/90 or less.   Lipids Yes   Goal Cholesterol controlled with medications as prescribed, with individualized exercise RX and with personalized nutrition plan. Value goals: LDL < 7079mHDL > 85m21marticipant states understanding of desired cholesterol values and following prescriptions.   Intervention (read-only) Provide nutrition & aerobic exercise along with prescribed medications to achieve LDL <70mg63mL >85mg.50mtress Yes   Goal To meet with psychosocial counselor for stress and relaxation information and guidance. To state understanding of performing relaxation techniques and or identifying personal stressors.   Intervention (read-only) Provide education on types of stress, identifiying stressors, and ways to cope with stress. Provide demonstration and active practice of  relaxation techniques.   Understand more about Heart/Pulmonary Disease. Yes   Intervention While in program utilize professionals for any questions, and attend the education sessions. Great websites to use are www.americanheart.org or www.lung.org for reliable information.       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    Expected Outcomes Benjamin Carrillo 's goal is to drop 2 more lbs. 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 .       Nutrition & Weight - Outcomes:     Pre Biometrics - 09/25/15 1527    Pre Biometrics   Height 5' 10.5" (1.791 m)   Weight 234 lb 8 oz (106.369 kg)   Waist Circumference 39 inches   Hip Circumference 41 inches   Waist to Hip Ratio 0.95 %   BMI (Calculated) 33.2         Post Biometrics - 11/22/15 0938     Post  Biometrics   Height 5' 10.5" (1.791 m)   Weight 225 lb (102.059 kg)   Waist Circumference 39 inches   Hip Circumference 39.5 inches   Waist to Hip Ratio 0.99 %   BMI (Calculated) 31.9      Nutrition:     Nutrition Therapy & Goals - 10/04/15 1152    Nutrition Therapy   Diet Instructed on a meal plan based on 2000 calories; DASH diet principles   Drug/Food Interactions Statins/Certain Fruits   Protein (oz) (read-only) 8 ounces/day   Fiber 35 grams   Whole Grain Foods 3 servings   Saturated Fats 13 max. grams   Fruits and Vegetables 5 servings/day   Personal Nutrition Goals   Personal Goal #1 Increase fruit and vegetable intake to minimum of 5 servings per day.   Personal Goal #2 Include at least 30 grams carbohydrate per meal with preferred range of 45-60 gms.   Personal Goal #3 Read labels for saturated fat, trans fat and sodium.   Comments Patient is making positive  changes in his diet. Diet is sometimes excessive in protein and low in healthy carbohydrates. He is also making lower sodium choices.Overall most of his food choices are low in saturated and trans fat.       Nutrition Discharge:     Nutrition Assessments - 11/22/15 1029    Rate Your Plate Scores   Post Score 78   Post Score % 86.6 %      Education Questionnaire Score:     Knowledge Questionnaire Score - 11/22/15 1032    Knowledge Questionnaire Score   Post Score 24      Goals reviewed with patient; copy given to patient.

## 2015-12-05 NOTE — Progress Notes (Signed)
Cardiac Individual Treatment Plan  Patient Details  Name: Benjamin Carrillo MRN: [id number] Date of Birth: 1971-07-27 Referring Provider:  Martinique, Peter M, MD  Initial Encounter Date:       Cardiac Rehab from 09/25/2015 in [facility] Cardiac and Pulmonary Rehab   Date  09/25/15      Visit Diagnosis: S/P PTCA (percutaneous transluminal coronary angioplasty)  ST elevation myocardial infarction involving right coronary artery [facility])  Patient's Home Medications on Admission:  Current outpatient prescriptions:  .  ALPRAZolam (XANAX) 0.5 MG tablet, Take 0.5 mg by mouth daily as needed for anxiety., Disp: , Rfl:  .  aspirin 81 MG chewable tablet, Chew 1 tablet (81 mg total) by mouth daily., Disp: , Rfl:  .  atorvastatin (LIPITOR) 80 MG tablet, Take 1 tablet (80 mg total) by mouth daily at 6 PM., Disp: 30 tablet, Rfl: 6 .  calcium carbonate (TUMS - DOSED IN MG ELEMENTAL CALCIUM) 500 MG chewable tablet, Chew 1 tablet by mouth daily as needed for indigestion or heartburn., Disp: , Rfl:  .  metoprolol tartrate (LOPRESSOR) 25 MG tablet, Take 1 tablet (25 mg total) by mouth 2 (two) times daily., Disp: 180 tablet, Rfl: 1 .  nitroGLYCERIN (NITROSTAT) 0.4 MG SL tablet, Place 1 tablet (0.4 mg total) under the tongue every 5 (five) minutes as needed for chest pain (up to 3 doses)., Disp: 25 tablet, Rfl: 3 .  oxyCODONE (OXY IR/ROXICODONE) 5 MG immediate release tablet, Take 5 mg by mouth daily as needed. for pain, Disp: , Rfl: 0 .  ticagrelor (BRILINTA) 90 MG TABS tablet, Take 1 tablet (90 mg total) by mouth 2 (two) times daily., Disp: 60 tablet, Rfl: 11 .  triamcinolone cream (KENALOG) 0.1 %, Apply 1 application topically 2 (two) times daily as needed., Disp: , Rfl: 0  Past Medical History: Past Medical History  Diagnosis Date  . Pinched nerve     L4  . CAD (coronary artery disease)     a. 09/08/15: inferior STEMI s/p DES of mid RCA  . HLD (hyperlipidemia)     Tobacco Use: History  Smoking status  .  Never Smoker   Smokeless tobacco  . Not on file    Labs: Recent Review Flowsheet Data    Labs for ITP Cardiac and Pulmonary Rehab Latest Ref Rng 09/08/2015 09/09/2015 10/22/2015   Cholestrol 125 - 200 mg/dL - 141 69(L)   LDLCALC <130 mg/dL - 89 28   HDL >=40 mg/dL - 27(L) 30(L)   Trlycerides <150 mg/dL - 126 55   Hemoglobin A1c 4.8 - 5.6 % 5.6 - -   TCO2 0 - 100 mmol/L 23 - -       Exercise Target Goals:    Exercise Program Goal: Individual exercise prescription set with THRR, safety & activity barriers. Participant demonstrates ability to understand and report RPE using BORG scale, to self-measure pulse accurately, and to acknowledge the importance of the exercise prescription.  Exercise Prescription Goal: Starting with aerobic activity 30 plus minutes a day, 3 days per week for initial exercise prescription. Provide home exercise prescription and guidelines that participant acknowledges understanding prior to discharge.  Activity Barriers & Risk Stratification:     Activity Barriers & Cardiac Risk Stratification - 09/25/15 1539    Activity Barriers & Cardiac Risk Stratification   Activity Barriers Back Problems   Cardiac Risk Stratification High      6 Minute Walk:     6 Minute Walk      09/25/15 1528  11/22/15 0935     6 Minute Walk   Phase Initial Discharge    Distance 1330 feet 1600 feet    Distance % Change  20 %    Walk Time 6 minutes 6 minutes    # of Rest Breaks  0    RPE 11 12    Symptoms Yes (comment) No    Comments Some chest tightness     Resting HR 78 bpm 74 bpm    Resting BP 122/86 mmHg 122/80 mmHg    Max Ex. HR 116 bpm 108 bpm    Max Ex. BP 144/84 mmHg 132/78 mmHg       Initial Exercise Prescription:     Initial Exercise Prescription - 09/25/15 1500    Date of Initial Exercise Prescription   Date 09/25/15   Treadmill   MPH 2.5   Grade 0   Minutes 15   Bike   Level 0.4   Watts 15   Minutes 15   Recumbant Bike   Level 5   RPM 45    Watts 50   Minutes 15   NuStep   Level 4   Watts 50   Minutes 15   Arm Ergometer   Level 2   Watts 12   Minutes 10   Arm/Foot Ergometer   Level 4   Watts 15   Minutes 10   Cybex   Level 3   RPM 60   Minutes 15   Recumbant Elliptical   Level 2   RPM 45   Watts 25   Minutes 15   Elliptical   Level 1   Speed 3   Minutes 1   REL-XR   Level 3   Watts 45   Minutes 15   T5 Nustep   Level 3   Watts 25   Minutes 15   Biostep-RELP   Level 3   Watts 30   Minutes 15   Prescription Details   Frequency (times per week) 3   Duration Progress to 30 minutes of continuous aerobic without signs/symptoms of physical distress   Intensity   THRR REST +  30   Ratings of Perceived Exertion 11-15   Progression   Progression Continue progressive overload as per policy without signs/symptoms or physical distress.   Resistance Training   Training Prescription Yes   Weight 3   Reps 10-15      Perform Capillary Blood Glucose checks as needed.  Exercise Prescription Changes:     Exercise Prescription Changes      10/01/15 0800 10/07/15 0800 10/09/15 0800 10/16/15 0900 10/23/15 0900   Exercise Review   Progression No  Has not started exercise, med review 09/25/15 Yes Yes Yes Yes   Response to Exercise   Symptoms  None None None None   Comments  Reviewed individualized exercise prescription and made increases per departmental policy. Exercise increases were discussed with the patient and they were able to perform the new work loads without issue (no signs or symptoms).    Increased Ernie on the elliptical to further challenge and progress him in aerobic exercise. He can consistently exercise for the entire 45 min of aerobic exercise.   Frequency     Add 2 additional days to program exercise sessions.  Shashank is exercising at home on days he is not in HT   Duration  Progress to 50 minutes of aerobic without signs/symptoms of physical distress Progress to 50 minutes of aerobic without  signs/symptoms of  physical distress Progress to 50 minutes of aerobic without signs/symptoms of physical distress Progress to 50 minutes of aerobic without signs/symptoms of physical distress   Intensity  Rest + 30 Rest + 30 Rest + 30 Rest + 30   Progression   Progression  Continue progressive overload as per policy without signs/symptoms or physical distress. Continue progressive overload as per policy without signs/symptoms or physical distress. Continue progressive overload as per policy without signs/symptoms or physical distress. Continue progressive overload as per policy without signs/symptoms or physical distress.   Resistance Training   Training Prescription (read-only)  Yes Yes Yes Yes   Weight (read-only)  '4 4 5 7   ' Reps (read-only)  10-15 10-15 10-15 10-15   Interval Training   Interval Training  No No Yes Yes   Equipment    Elliptical Elliptical   Recumbant Elliptical   Level (read-only)  '4 5 5 5   ' RPM (read-only)  40 65 65 65   Watts (read-only)  40 40 40 40   Minutes (read-only)  '20 30 30 ' 40   Elliptical   Level (read-only)  '15 4 4 6   ' Speed (read-only)  '3 4 4 4   ' Minutes (read-only)  '1 30 30 ' 45     10/28/15 1100 10/30/15 0900 11/01/15 0800 11/11/15 0900 11/22/15 0900   Exercise Review   Progression Yes Yes Yes Yes Yes   Response to Exercise   Blood Pressure (Admit) 118/74 mmHg       Blood Pressure (Exercise) 168/80 mmHg       Blood Pressure (Exit) 104/62 mmHg       Heart Rate (Admit) 73 bpm       Heart Rate (Exercise) 115 bpm       Heart Rate (Exit) 97 bpm       Rating of Perceived Exertion (Exercise) 15       Symptoms None Had low BP post exercise and was feeling dizzy. Stayed after class 20 min and took in fluids and BP came up slighty and dizziness went away. Patient said he was well enoguh to leave None None None   Comments Admiral does a great job of challenging himself and is now roatating between the TM and EL and using a 15% grade on the TM Laiken started jogging  intervals on the TM and completed them with no signs or symptoms duing the exercise A new target heart rate range for exercise was calculated based on the patient's ability to exercise with increased intensity. The range is 40-85% of HRR and encompasses moderate-vigorous exercise. The target heart rate range is equivalent to an RPE of 12-17. Khyren's new target is 118-162bpm Exercise intensity on the TM was increased. These increases were reviewed with the patient and he had no signs or symptoms associated with the increases. He tolerated his new workloads well.  Patient is approaching graduation of the program and home exercise plans were discussed. Details of the patient's exercise prescription and what they need to do in order to continue the prescription and progress with exercise were outlined and the patient verbalized understanding. The patient plans to complete all exercise at home and at his work gym doing resistance training circuits and using the treadmill and Probation officer.    Frequency Add 3 additional days to program exercise sessions. Add 3 additional days to program exercise sessions. Add 3 additional days to program exercise sessions.  Alessandro is maintaining regular home exercise Add 3 additional days to program exercise  sessions.  Arnol is maintaining regular home exercise Add 3 additional days to program exercise sessions.  Holston is maintaining regular home exercise   Duration Progress to 50 minutes of aerobic without signs/symptoms of physical distress Progress to 50 minutes of aerobic without signs/symptoms of physical distress Progress to 50 minutes of aerobic without signs/symptoms of physical distress Progress to 50 minutes of aerobic without signs/symptoms of physical distress Progress to 50 minutes of aerobic without signs/symptoms of physical distress   Intensity Rest + 30 Rest + 30 Other (comment)  118-162 (40-85% HRR) Other (comment)  118-162 (40-85% HRR) Other (comment)  118-162  (40-85% HRR)   Progression   Progression Continue progressive overload as per policy without signs/symptoms or physical distress. Continue progressive overload as per policy without signs/symptoms or physical distress. Continue progressive overload as per policy without signs/symptoms or physical distress. Continue progressive overload as per policy without signs/symptoms or physical distress. Continue progressive overload as per policy without signs/symptoms or physical distress.   Resistance Training   Training Prescription     Yes   Weight     10   Reps     10-15   Training Prescription (read-only) Yes Yes Yes Yes    Weight (read-only) '10 10 10 10    ' Reps (read-only) 10-15 10-15 10-15 10-15    Interval Training   Interval Training Yes Yes Yes Yes Yes   Equipment Treadmill;Elliptical Treadmill;Elliptical Treadmill;Elliptical Treadmill;Elliptical Treadmill;Elliptical   Comments TM: 3.7-4.1 w/15% grade. EL: L5-20 TM: 3.7-4.5 w/15% grade. EL: L5-20 TM: 3.7-4.5 w/15% grade. EL: L5-20 TM: 3.7-4.5 w/15% grade. EL: L5-20 TM: 3.7-4.5 w/15% grade. EL: L5-20   Treadmill   MPH     6  Jogging intervals up to 65mh and 15% incline   Grade     15   Minutes     430  MPH (read-only) 4.1 4.5 4.5 6  3-6 mph/0-15% intervals    Grade (read-only) '15 15 15 15    ' Minutes (read-only) 45 45 45 40    Elliptical   Level     20   Speed     4   Minutes     45   Level (read-only) '20 20 20 20    ' Speed (read-only) '4 4 4 4 4   ' Minutes (read-only) 45 45 45 45    Home Exercise Plan   Plans to continue exercise at     Home  Also at his work gym      Exercise Comments:   Discharge Exercise Prescription (Final Exercise Prescription Changes):     Exercise Prescription Changes - 11/22/15 0900    Exercise Review   Progression Yes   Response to Exercise   Symptoms None   Comments Patient is approaching graduation of the program and home exercise plans were discussed. Details of the patient's exercise  prescription and what they need to do in order to continue the prescription and progress with exercise were outlined and the patient verbalized understanding. The patient plans to complete all exercise at home and at his work gym doing resistance training circuits and using the treadmill and sProbation officer    Frequency Add 3 additional days to program exercise sessions.  DRashedis maintaining regular home exercise   Duration Progress to 50 minutes of aerobic without signs/symptoms of physical distress   Intensity Other (comment)  118-162 (40-85% HRR)   Progression   Progression Continue progressive overload as per policy without signs/symptoms or physical distress.  Resistance Training   Training Prescription Yes   Weight 10   Reps 10-15   Interval Training   Interval Training Yes   Equipment Treadmill;Elliptical   Comments TM: 3.7-4.5 w/15% grade. EL: L5-20   Treadmill   MPH 6  Jogging intervals up to 38mh and 15% incline   Grade 15   Minutes 40   Elliptical   Level 20   Speed 4   Minutes 45   Speed (read-only) 4   Home Exercise Plan   Plans to continue exercise at Home  Also at his work gym      Nutrition:  Target Goals: Understanding of nutrition guidelines, daily intake of sodium <15036m cholesterol <20043mcalories 30% from fat and 7% or less from saturated fats, daily to have 5 or more servings of fruits and vegetables.  Biometrics:     Pre Biometrics - 09/25/15 1527    Pre Biometrics   Height 5' 10.5" (1.791 m)   Weight 234 lb 8 oz (106.369 kg)   Waist Circumference 39 inches   Hip Circumference 41 inches   Waist to Hip Ratio 0.95 %   BMI (Calculated) 33.2         Post Biometrics - 11/22/15 0938     Post  Biometrics   Height 5' 10.5" (1.791 m)   Weight 225 lb (102.059 kg)   Waist Circumference 39 inches   Hip Circumference 39.5 inches   Waist to Hip Ratio 0.99 %   BMI (Calculated) 31.9      Nutrition Therapy Plan and Nutrition Goals:     Nutrition  Therapy & Goals - 10/04/15 1152    Nutrition Therapy   Diet Instructed on a meal plan based on 2000 calories; DASH diet principles   Drug/Food Interactions Statins/Certain Fruits   Protein (oz) (read-only) 8 ounces/day   Fiber 35 grams   Whole Grain Foods 3 servings   Saturated Fats 13 max. grams   Fruits and Vegetables 5 servings/day   Personal Nutrition Goals   Personal Goal #1 Increase fruit and vegetable intake to minimum of 5 servings per day.   Personal Goal #2 Include at least 30 grams carbohydrate per meal with preferred range of 45-60 gms.   Personal Goal #3 Read labels for saturated fat, trans fat and sodium.   Comments Patient is making positive changes in his diet. Diet is sometimes excessive in protein and low in healthy carbohydrates. He is also making lower sodium choices.Overall most of his food choices are low in saturated and trans fat.       Nutrition Discharge: Rate Your Plate Scores:     Nutrition Assessments - 11/22/15 1029    Rate Your Plate Scores   Post Score 78   Post Score % 86.6 %      Nutrition Goals Re-Evaluation:     Nutrition Goals Re-Evaluation      11/22/15 1036           Personal Goal #1 Re-Evaluation   Goal Progress Seen Yes       Comments DalMilos working on this.        Personal Goal #3 Re-Evaluation   Goal Progress Seen Yes       Comments He and his wife are reading labels more.           Psychosocial: Target Goals: Acknowledge presence or absence of depression, maximize coping skills, provide positive support system. Participant is able to verbalize types and ability to use techniques and  skills needed for reducing stress and depression.  Initial Review & Psychosocial Screening:     Initial Psych Review & Screening - 09/25/15 1827    Initial Review   Current issues with Current Depression;Current Stress Concerns   Source of Stress Concerns Occupation   Comments Scored 13 on PHQ-9.  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  Sometimes patient feels he is depressed .  Patient takes Xanax prn for anxiety.     Family Dynamics   Good Support System? Yes  Patient has one wife and 3 children.  Is a member of a large [employer], [place].     Barriers   Psychosocial barriers to participate in program The patient should benefit from training in stress management and relaxation.   Screening Interventions   Interventions Encouraged to exercise;Program counselor consult      Quality of Life Scores:     Quality of Life - 11/22/15 1030    Quality of Life Scores   Health/Function Post 26.4 %   Socioeconomic Post 27.43 %   Psych/Spiritual Post 25.71 %   Family Post 30 %   GLOBAL Post 27 %      PHQ-9:     Recent Review Flowsheet Data    Depression screen [facility] 2/9 11/22/2015 09/25/2015   Decreased Interest 1 1   Down, Depressed, Hopeless 0 1   PHQ - 2 Score 1 2   Altered sleeping 1 3   Tired, decreased energy 2 3   Change in appetite 1 3   Feeling bad or failure about yourself  0 1   Trouble concentrating 1 0   Moving slowly or fidgety/restless 0 1   Suicidal thoughts 0 0   PHQ-9 Score 6 13   Difficult doing work/chores Somewhat difficult Somewhat difficult      Psychosocial Evaluation and Intervention:     Psychosocial Evaluation - 10/02/15 1026    Psychosocial Evaluation & Interventions   Interventions Stress management education;Relaxation education;Encouraged to exercise with the program and follow exercise prescription   Comments Counselor met with Mr. Kiedrowski today for initial psychosocial evaluation.  He is a 45 year old who had a heart attack on "Christmas Day" and had a stent inserted.  He has a strong support system with a spouse of 16 years, adult children and parents close  by, as well as active involvement in his local church.  Mr. Livingood reports having intermittent sleep and is not taking anything currently for this.  He has a good appetite and denies a history of anxiety or depression.  Counselor discussed the PHQ-9 scores of 13 which indicate some possible depressive symptoms and he agreed he has a fear of losing his job over this health issue, so his mood is impacted.  Mr. 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  He has goals to learn his exercise limitations since the heart attack, and to increase his stamina and strength.  Counselor recommended Mr. Nicolosi consult with his Dr. or pharmacist about use of a natural OTC  to help with intermittent sleep concerns.  He is also recommended to consider counseling to help with ways to manage stress and learn relaxation as well as help with transitions and job stress currently.  Counselor will follow with Mr. Couts as needed.   Continued Psychosocial Services Needed Yes  Mr. Alverio will benefit from psychosocial education on relaxation and stress management and consistent exercise.      Psychosocial Re-Evaluation:     Psychosocial Re-Evaluation      10/11/15 0919 10/21/15 0949 11/22/15 1032       Psychosocial Re-Evaluation   Interventions Encouraged to attend Cardiac Rehabilitation for the exercise       Comments I asked Maddoxx what was decided about him returning back to work since [employer] can not return to work on a blood thinner. He said [place] has the same regulations for [employer] as [employer].  Follow up with Mr. Dec stating he is sleeping better now since taking a natural OTC sleep aid nightly.  He also reports feeling stronger since coming into this program.  His Dr. has ordered a baseline stress test tomorrow  to help determine if Mr. 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  Mr. Chauncey Cruel is a little concerned about that and plans to do his best - no matter what.  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  So he has some mixed feelings about this considering what he has been through.  Counselor will continue to follow with him and encourages him to participate in the upcoming stress management component of this program.   Robbin reports he has tried several times to call to schedule his test but has not gotten up with them.         Vocational Rehabilitation: Provide vocational rehab assistance to qualifying candidates.   Vocational Rehab Evaluation & Intervention:     Vocational Rehab - 09/25/15 1540    Initial Vocational Rehab Evaluation & Intervention   Assessment shows need for Vocational Rehabilitation No      Education: Education Goals: Education classes will be provided on a weekly basis, covering required topics. Participant will state understanding/return demonstration of topics presented.  Learning Barriers/Preferences:     Learning Barriers/Preferences - 09/25/15 1539    Learning Barriers/Preferences   Learning Barriers None   Learning Preferences Written Material;Group Instruction;Video      Education Topics: General Nutrition Guidelines/Fats and Fiber: -Group instruction provided by verbal, written material, models and posters to present the general guidelines for heart healthy nutrition. Gives an explanation and review of dietary fats and fiber.          Cardiac Rehab from 11/25/2015 in [facility] Cardiac and Pulmonary Rehab   Date  10/21/15   Educator  CR   Instruction Review Code  2- meets goals/outcomes      Controlling Sodium/Reading Food Labels: -Group verbal and written material supporting the discussion of sodium use in heart healthy nutrition. Review and explanation with  models, verbal and written materials for utilization of the food label.      Cardiac Rehab from 11/25/2015 in [facility] Cardiac and Pulmonary Rehab   Date  10/28/15   Educator  CR   Instruction Review Code  2- meets goals/outcomes      Exercise Physiology & Risk Factors: - Group verbal and written instruction with models to review the exercise physiology  of the cardiovascular system and associated critical values. Details cardiovascular disease risk factors and the goals associated with each risk factor.      Cardiac Rehab from 11/25/2015 in [facility] Cardiac and Pulmonary Rehab   Date  11/11/15   Educator  RM   Instruction Review Code  2- meets goals/outcomes      Aerobic Exercise & Resistance Training: - Gives group verbal and written discussion on the health impact of inactivity. On the components of aerobic and resistive training programs and the benefits of this training and how to safely progress through these programs.      Cardiac Rehab from 11/25/2015 in [facility] Cardiac and Pulmonary Rehab   Date  11/13/15   Educator  SW   Instruction Review Code  2- meets goals/outcomes      Flexibility, Balance, General Exercise Guidelines: - Provides group verbal and written instruction on the benefits of flexibility and balance training programs. Provides general exercise guidelines with specific guidelines to those with heart or lung disease. Demonstration and skill practice provided.      Cardiac Rehab from 11/25/2015 in [facility] Cardiac and Pulmonary Rehab   Date  11/18/15   Educator  [facility]   Instruction Review Code  2- meets goals/outcomes      Stress Management: - Provides group verbal and written instruction about the health risks of elevated stress, cause of high stress, and healthy ways to reduce stress.      Cardiac Rehab from 11/25/2015 in [facility] Cardiac and Pulmonary Rehab   Date  11/20/15   Educator  [facility]   Instruction Review Code  2- meets goals/outcomes      Depression: - Provides group  verbal and written instruction on the correlation between heart/lung disease and depressed mood, treatment options, and the stigmas associated with seeking treatment.      Cardiac Rehab from 11/25/2015 in [facility] Cardiac and Pulmonary Rehab   Date  11/06/15   Educator  [facility]   Instruction Review Code  2- meets goals/outcomes      Anatomy & Physiology of the Heart: - Group verbal and written instruction and models provide basic cardiac anatomy and physiology, with the coronary electrical and arterial systems. Review of: AMI, Angina, Valve disease, Heart Failure, Cardiac Arrhythmia, Pacemakers, and the ICD.      Cardiac Rehab from 11/25/2015 in [facility] Cardiac and Pulmonary Rehab   Date  11/25/15   Educator  SB   Instruction Review Code  2- meets goals/outcomes      Cardiac Procedures: - Group verbal and written instruction and models to describe the testing methods done to diagnose heart disease. Reviews the outcomes of the test results. Describes the treatment choices: Medical Management, Angioplasty, or Coronary Bypass Surgery.      Cardiac Rehab from 11/25/2015 in [facility] Cardiac and Pulmonary Rehab   Date  11/04/15   Educator  SB   Instruction Review Code  2- meets goals/outcomes      Cardiac Medications: - Group verbal and written instruction to review commonly prescribed medications for heart disease. Reviews the medication, class of the drug, and side effects. Includes the steps to properly store meds and maintain the prescription regimen.      Cardiac Rehab from 11/25/2015 in [facility] Cardiac and Pulmonary Rehab   Date  10/23/15   Educator  SB   Instruction Review Code  2- meets goals/outcomes      Go Sex-Intimacy & Heart Disease, Get SMART - Goal Setting: - Group verbal and written instruction  through game format to discuss heart disease and the return to sexual intimacy. Provides group verbal and written material to discuss and apply goal setting through the application of the S.M.A.R.T.  Method.      Cardiac Rehab from 11/25/2015 in [facility] Cardiac and Pulmonary Rehab   Date  11/04/15   Educator  SB   Instruction Review Code  2- meets goals/outcomes      Other Matters of the Heart: - Provides group verbal, written materials and models to describe Heart Failure, Angina, Valve Disease, and Diabetes in the realm of heart disease. Includes description of the disease process and treatment options available to the cardiac patient.      Cardiac Rehab from 11/25/2015 in [facility] Cardiac and Pulmonary Rehab   Date  10/14/15   Educator  SB   Instruction Review Code  2- meets goals/outcomes      Exercise & Equipment Safety: - Individual verbal instruction and demonstration of equipment use and safety with use of the equipment.      Cardiac Rehab from 11/25/2015 in [facility] Cardiac and Pulmonary Rehab   Date  09/25/15   Educator  DW   Instruction Review Code  1- partially meets, needs review/practice      Infection Prevention: - Provides verbal and written material to individual with discussion of infection control including proper hand washing and proper equipment cleaning during exercise session.      Cardiac Rehab from 11/25/2015 in [facility] Cardiac and Pulmonary Rehab   Date  09/25/15   Educator  DW   Instruction Review Code  2- meets goals/outcomes      Falls Prevention: - Provides verbal and written material to individual with discussion of falls prevention and safety.      Cardiac Rehab from 11/25/2015 in [facility] Cardiac and Pulmonary Rehab   Date  09/25/15   Educator  DW   Instruction Review Code  2- meets goals/outcomes      Diabetes: - Individual verbal and written instruction to review signs/symptoms of diabetes, desired ranges of glucose level fasting, after meals and with exercise. Advice that pre and post exercise glucose checks will be done for 3 sessions at entry of program.    Knowledge Questionnaire Score:     Knowledge Questionnaire Score - 11/22/15 1032     Knowledge Questionnaire Score   Post Score 24      Personal Goals and Risk Factors at Admission:     Personal Goals and Risk Factors at Admission - 09/25/15 1823    Core Components/Risk Factors/Patient Goals on Admission    Weight Management Yes   Intervention (read-only) Learn and follow the exercise and diet guidelines while in the program. Utilize the nutrition and education classes to help gain knowledge of the diet and exercise expectations in the program   Admit Weight 234 lb 6.4 oz (106.323 kg)   Goal Weight: Short Term 210 lb (95.255 kg)   Sedentary Yes   Intervention (read-only) While in program, learn and follow the exercise prescription taught. Start at a low level workload and increase workload after able to maintain previous level for 30 minutes. Increase time before increasing intensity.   Diabetes No   Hypertension Yes   Goal Participant will see blood pressure controlled within the values of 140/52m/Hg or within value directed by their physician.   Intervention (read-only) Provide nutrition & aerobic exercise along with prescribed medications to achieve BP 140/90 or less.   Lipids Yes   Goal Cholesterol controlled with medications  as prescribed, with individualized exercise RX and with personalized nutrition plan. Value goals: LDL < 37m, HDL > 488m Participant states understanding of desired cholesterol values and following prescriptions.   Intervention (read-only) Provide nutrition & aerobic exercise along with prescribed medications to achieve LDL <7074mHDL >92m59m Stress Yes   Goal To meet with psychosocial counselor for stress and relaxation information and guidance. To state understanding of performing relaxation techniques and or identifying personal stressors.   Intervention (read-only) Provide education on types of stress, identifiying stressors, and ways to cope with stress. Provide demonstration and active practice of relaxation techniques.   Understand more  about Heart/Pulmonary Disease. Yes   Intervention While in program utilize professionals for any questions, and attend the education sessions. Great websites to use are www.americanheart.org or www.lung.org for reliable information.      Personal Goals and Risk Factors Review:      Goals and Risk Factor Review      10/07/15 [id number] 11/22/15 1037       Core Components/Risk Factors/Patient Goals Review   Personal Goals Review Increase Aerobic Exercise and Physical Activity  Weight Management/Obesity;Hypertension;Lipids;Stress     Review   [place] from 234lbs to 225lbs.. 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      Expected Outcomes   DaleGuilhermegoal is to drop 2 more lbs. 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 .      Increase Aerobic Exercise and Physical Activity (read-only)   Goals Progress/Improvement seen  Yes Yes      Comments DaleMarkesell has days where he feels low energy and this is usually correlated with bad sleep the night before. However, he is still making gains in his aerobic endurance. He is younger and has a history of being physically active which and these factors contribute to his quick progress.  DaleCaesaradpating to the exercise and can now consistently exercise at high workloads and high intensity. He is exercising for 45 minutes continuously for the aerobic period of class and is up to 10lb during weight training. 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 He has increased his workloads in order to prepare for this test. Right now using a high incline on the treadmill and high workload on the EL wil help increase leg strength for climbing stairs when he returns to work. We will check in with him in the coming weeks on more we can do duirng his time in rehab to prepare for his fitness test.          Personal Goals Discharge (Final  Personal Goals and Risk Factors Review):      Goals and Risk Factor Review - 11/22/15 1037    Core Components/Risk Factors/Patient Goals Review   Personal Goals Review Weight Management/Obesity;Hypertension;Lipids;Stress   Review [place] from 234lbs to 225lbs.. 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    Expected Outcomes DaleEwingoal is to drop 2 more lbs. 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 .       ITP Comments:     ITP Comments      10/10/15 1229 11/05/15 0814 11/08/15 0920 12/05/15 1313     ITP Comments Ready for 30 day review. Continue with ITP. 30 day review.  Continue with ITP.  returning to work this week. [place] was tired today. He has returned to work light duty. 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  discharged  completed program sessions       Comments:

## 2016-02-05 ENCOUNTER — Telehealth: Payer: Self-pay | Admitting: Cardiology

## 2016-02-05 NOTE — Telephone Encounter (Signed)
Request for surgical clearance:  1. What type of surgery is being performed? Colonoscopy   2. When is this surgery scheduled? Not scheduled yet   3. Are there any medications that need to be held prior to surgery and how long?Can he stop his Brilinta and hold for 5 days   4. Name of physician performing surgery?Dr Loistine Simas   5. What is your office phone and fax number? [phone] and fax number is [phone]  6.

## 2016-02-05 NOTE — Telephone Encounter (Signed)
Message sent to Dr.Jordan for advice. 

## 2016-02-05 NOTE — Telephone Encounter (Signed)
Patient is s/p inferior STEMI on September 08 2015. I would not recommend he stop Brilinta for elective procedures for one year.   Salle Brandle Martinique MD, [facility]

## 2016-02-06 NOTE — Telephone Encounter (Signed)
Returned call to [place] at [place] recommendations given.  Patient notified of Dr.Jordan's recommendations.He stated he wanted to cancel 02/14/16 follow appointment with Dr.Jordan.Stated he will call back to reschedule.

## 2016-02-14 ENCOUNTER — Ambulatory Visit: Payer: BLUE CROSS/BLUE SHIELD | Admitting: Cardiology

## 2016-02-26 ENCOUNTER — Emergency Department
Admission: EM | Admit: 2016-02-26 | Discharge: 2016-02-26 | Disposition: A | Discharge status: Home / Self Care | Payer: BLUE CROSS/BLUE SHIELD | Attending: Emergency Medicine | Admitting: Emergency Medicine

## 2016-02-26 DIAGNOSIS — I2510 Atherosclerotic heart disease of native coronary artery without angina pectoris: Secondary | ICD-10-CM | POA: Diagnosis not present

## 2016-02-26 DIAGNOSIS — Z79899 Other long term (current) drug therapy: Secondary | ICD-10-CM | POA: Diagnosis not present

## 2016-02-26 DIAGNOSIS — H6091 Unspecified otitis externa, right ear: Secondary | ICD-10-CM | POA: Diagnosis not present

## 2016-02-26 DIAGNOSIS — Z7982 Long term (current) use of aspirin: Secondary | ICD-10-CM | POA: Insufficient documentation

## 2016-02-26 DIAGNOSIS — I252 Old myocardial infarction: Secondary | ICD-10-CM | POA: Insufficient documentation

## 2016-02-26 DIAGNOSIS — E785 Hyperlipidemia, unspecified: Secondary | ICD-10-CM | POA: Insufficient documentation

## 2016-02-26 DIAGNOSIS — H9201 Otalgia, right ear: Secondary | ICD-10-CM

## 2016-02-26 MED ORDER — OXYCODONE-ACETAMINOPHEN 5-325 MG PO TABS: 1.0000 | ORAL_TABLET | ORAL | Status: DC | PRN

## 2016-02-26 MED ORDER — OXYCODONE-ACETAMINOPHEN 5-325 MG PO TABS
1.0000 | ORAL_TABLET | Freq: Once | ORAL | Status: AC
  Administered 2016-02-26: 1 via ORAL
  Filled 2016-02-26: qty 1

## 2016-02-26 MED ORDER — CIPROFLOXACIN-DEXAMETHASONE 0.3-0.1 % OT SUSP: 4.0000 [drp] | Freq: Two times a day (BID) | OTIC | Status: AC

## 2016-02-26 MED ORDER — IBUPROFEN 800 MG PO TABS: 800.0000 mg | ORAL_TABLET | Freq: Three times a day (TID) | ORAL | Status: DC | PRN

## 2016-02-26 MED ORDER — KETOROLAC TROMETHAMINE 30 MG/ML IJ SOLN
60.0000 mg | Freq: Once | INTRAMUSCULAR | Status: AC
  Administered 2016-02-26: 60 mg via INTRAMUSCULAR
  Filled 2016-02-26: qty 2

## 2016-02-26 MED ORDER — CIPROFLOXACIN-DEXAMETHASONE 0.3-0.1 % OT SUSP
4.0000 [drp] | Freq: Once | OTIC | Status: AC
  Administered 2016-02-26: 4 [drp] via OTIC
  Filled 2016-02-26: qty 7.5

## 2016-02-26 NOTE — Discharge Instructions (Signed)
1. You may take pain medicines as needed (Motrin/Percocet). 2. Stop using your current antibiotic ear drop. Instead, use Ciprodex ear drops - 4 drops to right ear twice daily 7 days. 3. Return to the ER for worsening symptoms, persistent vomiting, fever or other concerns.  Earache An earache, also called otalgia, can be caused by many things. Pain from an earache can be sharp, dull, or burning. The pain may be temporary or constant. Earaches can be caused by problems with the ear, such as infection in either the middle ear or the ear canal, injury, impacted ear wax, middle ear pressure, or a foreign body in the ear. Ear pain can also result from problems in other areas. This is called referred pain. For example, pain can come from a sore throat, a tooth infection, or problems with the jaw or the joint between the jaw and the skull (temporomandibular joint, or TMJ). The cause of an earache is not always easy to identify. Watchful waiting may be appropriate for some earaches until a clear cause of the pain can be found. HOME CARE INSTRUCTIONS Watch your condition for any changes. The following actions may help to lessen any discomfort that you are feeling:  Take medicines only as directed by your health care provider. This includes ear drops.  Apply ice to your outer ear to help reduce pain.  Put ice in a plastic bag.  Place a towel between your skin and the bag.  Leave the ice on for 20 minutes, 2-3 times per day.  Do not put anything in your ear other than medicine that is prescribed by your health care provider.  Try resting in an upright position instead of lying down. This may help to reduce pressure in the middle ear and relieve pain.  Chew gum if it helps to relieve your ear pain.  Control any allergies that you have.  Keep all follow-up visits as directed by your health care provider. This is important. SEEK MEDICAL CARE IF:  Your pain does not improve within 2 days.  You  have a fever.  You have new or worsening symptoms. SEEK IMMEDIATE MEDICAL CARE IF:  You have a severe headache.  You have a stiff neck.  You have difficulty swallowing.  You have redness or swelling behind your ear.  You have drainage from your ear.  You have hearing loss.  You feel dizzy.   This information is not intended to replace advice given to you by your health care provider. Make sure you discuss any questions you have with your health care provider.   Document Released: 04/17/2004 Document Revised: 09/21/2014 Document Reviewed: 04/01/2014 Elsevier Interactive Patient Education 2016 Elsevier Inc[employer]  Otitis Externa Otitis externa is a bacterial or fungal infection of the outer ear canal. This is the area from the eardrum to the outside of the ear. Otitis externa is sometimes called "swimmer's ear." CAUSES  Possible causes of infection include:  Swimming in dirty water.  Moisture remaining in the ear after swimming or bathing.  Mild injury (trauma) to the ear.  Objects stuck in the ear (foreign body).  Cuts or scrapes (abrasions) on the outside of the ear. SIGNS AND SYMPTOMS  The first symptom of infection is often itching in the ear canal. Later signs and symptoms may include swelling and redness of the ear canal, ear pain, and yellowish-white fluid (pus) coming from the ear. The ear pain may be worse when pulling on the earlobe. DIAGNOSIS  Your health care provider  will perform a physical exam. A sample of fluid may be taken from the ear and examined for bacteria or fungi. TREATMENT  Antibiotic ear drops are often given for 10 to 14 days. Treatment may also include pain medicine or corticosteroids to reduce itching and swelling. HOME CARE INSTRUCTIONS   Apply antibiotic ear drops to the ear canal as prescribed by your health care provider.  Take medicines only as directed by your health care provider.  If you have diabetes, follow any additional treatment  instructions from your health care provider.  Keep all follow-up visits as directed by your health care provider. PREVENTION   Keep your ear dry. Use the corner of a towel to absorb water out of the ear canal after swimming or bathing.  Avoid scratching or putting objects inside your ear. This can damage the ear canal or remove the protective wax that lines the canal. This makes it easier for bacteria and fungi to grow.  Avoid swimming in lakes, polluted water, or poorly chlorinated pools.  You may use ear drops made of rubbing alcohol and vinegar after swimming. Combine equal parts of white vinegar and alcohol in a bottle. Put 3 or 4 drops into each ear after swimming. SEEK MEDICAL CARE IF:   You have a fever.  Your ear is still red, swollen, painful, or draining pus after 3 days.  Your redness, swelling, or pain gets worse.  You have a severe headache.  You have redness, swelling, pain, or tenderness in the area behind your ear. MAKE SURE YOU:   Understand these instructions.  Will watch your condition.  Will get help right away if you are not doing well or get worse.   This information is not intended to replace advice given to you by your health care provider. Make sure you discuss any questions you have with your health care provider.   Document Released: 08/31/2005 Document Revised: 09/21/2014 Document Reviewed: 09/17/2011 Elsevier Interactive Patient Education [employer].

## 2016-02-26 NOTE — ED Notes (Signed)
Pt states R ear pain that began today (he states), saw doctor few days ago and was given Neomycin drops. States ear pain that runs down neck and up to head, feels "like there is a plug in my ear."

## 2016-02-26 NOTE — ED Provider Notes (Signed)
[facility] Emergency Department Provider Note   ____________________________________________  Time seen: Approximately 2:49 AM  I have reviewed the triage vital signs and the nursing notes.   HISTORY  Chief Complaint Otalgia    HPI Benjamin Carrillo is a 45 y.o. male who presents to the ED from home with a chief complaint of right earache. Patient states right ear pain began 2 days ago. Saw his PCP and started on Cortisporin eardrops for infection. States pain has not improved and is now radiating from his right ear to his head and neck. States he has muffled hearing "like there is a plug in my ear". Denies bleeding or drainage. Denies associated fever, chills, chest pain, shortness of breath, abdominal pain, nausea, vomiting, diarrhea. Denies barotrauma, recent travel or trauma. Nothing makes his pain better or worse.   Past Medical History  Diagnosis Date  . Pinched nerve     L4  . CAD (coronary artery disease)     a. 09/08/15: inferior STEMI s/p DES of mid RCA  . HLD (hyperlipidemia)     Patient Active Problem List   Diagnosis Date Noted  . CAD (coronary artery disease)   . HLD (hyperlipidemia)   . Elevated blood pressure 09/08/2015  . ST elevation (STEMI) myocardial infarction involving right coronary artery ([place]) 09/08/2015    Past Surgical History  Procedure Laterality Date  . Inguinal hernia repair    . Cardiac catheterization N/A 09/08/2015    Procedure: Left Heart Cath and Coronary Angiography;  Surgeon: Peter M Martinique, MD;  Location: [facility];  Service: Cardiovascular;  Laterality: N/A;  . Cardiac catheterization N/A 09/08/2015    Procedure: Coronary Stent Intervention;  Surgeon: Peter M Martinique, MD;  Location: [facility];  Service: Cardiovascular;  Laterality: N/A;  mid rca     Current Outpatient Rx  Name  Route  Sig  Dispense  Refill  . ALPRAZolam (XANAX) 0.5 MG tablet   Oral   Take 0.5 mg by mouth daily as  needed for anxiety.         Marland Kitchen aspirin 81 MG chewable tablet   Oral   Chew 1 tablet (81 mg total) by mouth daily.         Marland Kitchen atorvastatin (LIPITOR) 80 MG tablet   Oral   Take 1 tablet (80 mg total) by mouth daily at 6 PM.   30 tablet   6   . calcium carbonate (TUMS - DOSED IN MG ELEMENTAL CALCIUM) 500 MG chewable tablet   Oral   Chew 1 tablet by mouth daily as needed for indigestion or heartburn.         . ciprofloxacin-dexamethasone (CIPRODEX) otic suspension   Right Ear   Place 4 drops into the right ear 2 (two) times daily.   7.5 mL   1   . ibuprofen (ADVIL,MOTRIN) 800 MG tablet   Oral   Take 1 tablet (800 mg total) by mouth every 8 (eight) hours as needed for moderate pain.   15 tablet   0   . metoprolol tartrate (LOPRESSOR) 25 MG tablet   Oral   Take 1 tablet (25 mg total) by mouth 2 (two) times daily.   180 tablet   1   . nitroGLYCERIN (NITROSTAT) 0.4 MG SL tablet   Sublingual   Place 1 tablet (0.4 mg total) under the tongue every 5 (five) minutes as needed for chest pain (up to 3 doses).   25 tablet  3   . oxyCODONE (OXY IR/ROXICODONE) 5 MG immediate release tablet   Oral   Take 5 mg by mouth daily as needed. for pain      0   . oxyCODONE-acetaminophen (ROXICET) 5-325 MG tablet   Oral   Take 1 tablet by mouth every 4 (four) hours as needed for severe pain.   15 tablet   0   . ticagrelor (BRILINTA) 90 MG TABS tablet   Oral   Take 1 tablet (90 mg total) by mouth 2 (two) times daily.   60 tablet   11   . triamcinolone cream (KENALOG) 0.1 %   Topical   Apply 1 application topically 2 (two) times daily as needed.      0     Allergies Penicillins and Erythromycin  Family History  Problem Relation Age of Onset  . CAD Maternal Grandfather   . CAD Paternal Grandfather     Social History Social History  Substance Use Topics  . Smoking status: Never Smoker   . Smokeless tobacco: Not on file  . Alcohol Use: 0.0 oz/week    0 Standard  drinks or equivalent per week     Comment: Rare    Review of Systems  Constitutional: No fever/chills. Eyes: No visual changes. ENT: Positive for right ear pain. No sore throat. Cardiovascular: Denies chest pain. Respiratory: Denies shortness of breath. Gastrointestinal: No abdominal pain.  No nausea, no vomiting.  No diarrhea.  No constipation. Genitourinary: Negative for dysuria. Musculoskeletal: Negative for back pain. Skin: Negative for rash. Neurological: Negative for headaches, focal weakness or numbness.  10-point ROS otherwise negative.  ____________________________________________   PHYSICAL EXAM:  VITAL SIGNS: ED Triage Vitals  Enc Vitals Group     BP 02/26/16 0100 134/84 mmHg     Pulse Rate 02/26/16 0059 65     Resp 02/26/16 0059 18     Temp 02/26/16 0059 97.9 F (36.6 C)     Temp Source 02/26/16 0059 Oral     SpO2 02/26/16 0059 100 %     Weight 02/26/16 0059 220 lb (99.791 kg)     Height 02/26/16 0059 5\' 10"  (1.778 m)     Head Cir --      Peak Flow --      Pain Score 02/26/16 0100 10     Pain Loc --      Pain Edu? --      Excl. in [place]? --     Constitutional: Alert and oriented. Well appearing and in mild acute distress. Eyes: Conjunctivae are normal. PERRL. EOMI. Head: Atraumatic. Ears: Left TM within normal limits. Right ear tender to touch and left for examination. Right ear canal is moderately swollen with purulent debris in the canal. No tenderness at the mastoid. Nose: No congestion/rhinnorhea. Mouth/Throat: Mucous membranes are moist.  Oropharynx non-erythematous. Neck: No stridor.   Cardiovascular: Normal rate, regular rhythm. Grossly normal heart sounds.  Good peripheral circulation. Respiratory: Normal respiratory effort.  No retractions. Lungs CTAB. Gastrointestinal: Soft and nontender. No distention. No abdominal bruits. No CVA tenderness. Musculoskeletal: No lower extremity tenderness nor edema.  No joint effusions. Neurologic:  Normal  speech and language. No gross focal neurologic deficits are appreciated. No gait instability. Skin:  Skin is warm, dry and intact. No rash noted. Psychiatric: Mood and affect are normal. Speech and behavior are normal.  ____________________________________________   LABS (all labs ordered are listed, but only abnormal results are displayed)  Labs Reviewed - No data to display  ____________________________________________  EKG  None ____________________________________________  RADIOLOGY  None ____________________________________________   PROCEDURES  Procedure(s) performed: None  Critical Care performed: No  ____________________________________________   INITIAL IMPRESSION / ASSESSMENT AND PLAN / ED COURSE  Pertinent labs & imaging results that were available during my care of the patient were reviewed by me and considered in my medical decision making (see chart for details).  45 year old male who presents with right otalgia secondary to otitis externa. Will switch antibiotic ear drops to Ciprodex, prescriptions for analgesia and follow-up with ENT. Strict return precautions given. Patient verbalizes understanding and agrees with plan of care. ____________________________________________   FINAL CLINICAL IMPRESSION(S) / ED DIAGNOSES  Final diagnoses:  Otalgia, right  Otitis external, right      NEW MEDICATIONS STARTED DURING THIS VISIT:  New Prescriptions   CIPROFLOXACIN-DEXAMETHASONE (CIPRODEX) OTIC SUSPENSION    Place 4 drops into the right ear 2 (two) times daily.   IBUPROFEN (ADVIL,MOTRIN) 800 MG TABLET    Take 1 tablet (800 mg total) by mouth every 8 (eight) hours as needed for moderate pain.   OXYCODONE-ACETAMINOPHEN (ROXICET) 5-325 MG TABLET    Take 1 tablet by mouth every 4 (four) hours as needed for severe pain.     Note:  This document was prepared using Dragon voice recognition software and may include unintentional dictation errors.    Paulette Blanch, MD 02/26/16 [phone]

## 2016-02-26 NOTE — ED Notes (Signed)
Pt in with co right earache saw pmd 2 days ago and started on cortisporin but states pain is not improved.

## 2017-07-23 LAB — LIPID PANEL
Cholesterol: 157 (ref 0–200)
HDL: 30 — AB (ref 35–70)
LDL Cholesterol: 66
Triglycerides: 303 — AB (ref 40–160)

## 2018-05-17 LAB — LIPID PANEL
Cholesterol: 127 (ref 0–200)
HDL: 40 (ref 35–70)
LDL Cholesterol: 67
Triglycerides: 99 (ref 40–160)

## 2018-05-17 LAB — PSA: PSA: 2

## 2018-07-29 ENCOUNTER — Other Ambulatory Visit: Payer: Self-pay | Admitting: Gastroenterology

## 2018-07-29 DIAGNOSIS — R945 Abnormal results of liver function studies: Principal | ICD-10-CM

## 2018-07-29 DIAGNOSIS — R7989 Other specified abnormal findings of blood chemistry: Secondary | ICD-10-CM

## 2018-08-04 ENCOUNTER — Ambulatory Visit
Admission: RE | Admit: 2018-08-04 | Discharge: 2018-08-04 | Disposition: A | Discharge status: Home / Self Care | Payer: BLUE CROSS/BLUE SHIELD | Source: Ambulatory Visit | Attending: Gastroenterology | Admitting: Gastroenterology

## 2018-08-04 DIAGNOSIS — D1803 Hemangioma of intra-abdominal structures: Secondary | ICD-10-CM | POA: Insufficient documentation

## 2018-08-04 DIAGNOSIS — R945 Abnormal results of liver function studies: Secondary | ICD-10-CM | POA: Diagnosis present

## 2018-08-04 DIAGNOSIS — R7989 Other specified abnormal findings of blood chemistry: Secondary | ICD-10-CM

## 2018-09-23 ENCOUNTER — Other Ambulatory Visit: Payer: Self-pay | Admitting: Family Medicine

## 2019-01-26 ENCOUNTER — Other Ambulatory Visit: Payer: Self-pay | Admitting: Family Medicine

## 2019-02-23 ENCOUNTER — Other Ambulatory Visit: Payer: Self-pay | Admitting: Family Medicine

## 2019-02-24 ENCOUNTER — Other Ambulatory Visit: Payer: Self-pay | Admitting: Family Medicine

## 2019-02-25 ENCOUNTER — Other Ambulatory Visit: Payer: Self-pay | Admitting: Family Medicine

## 2019-03-02 ENCOUNTER — Ambulatory Visit: Payer: Self-pay

## 2019-03-02 ENCOUNTER — Ambulatory Visit: Payer: Self-pay | Admitting: Internal Medicine

## 2019-03-06 ENCOUNTER — Ambulatory Visit: Payer: Self-pay

## 2019-03-06 ENCOUNTER — Other Ambulatory Visit: Payer: Self-pay

## 2019-03-06 DIAGNOSIS — R7989 Other specified abnormal findings of blood chemistry: Secondary | ICD-10-CM

## 2019-03-08 ENCOUNTER — Other Ambulatory Visit: Payer: Self-pay | Admitting: Internal Medicine

## 2019-03-08 LAB — TESTOSTERONE
Testosterone: 1019
Testosterone: 771

## 2019-03-09 ENCOUNTER — Other Ambulatory Visit: Payer: Self-pay

## 2019-03-09 ENCOUNTER — Ambulatory Visit: Payer: Self-pay | Admitting: Internal Medicine

## 2019-03-09 ENCOUNTER — Encounter: Payer: Self-pay | Admitting: Internal Medicine

## 2019-03-09 VITALS — BP 136/87 | HR 97 | Temp 98.0°F | Resp 16 | Ht 71.0 in | Wt 226.0 lb

## 2019-03-09 DIAGNOSIS — E663 Overweight: Secondary | ICD-10-CM

## 2019-03-09 DIAGNOSIS — E291 Testicular hypofunction: Secondary | ICD-10-CM

## 2019-03-09 DIAGNOSIS — D751 Secondary polycythemia: Secondary | ICD-10-CM

## 2019-03-09 DIAGNOSIS — I25810 Atherosclerosis of coronary artery bypass graft(s) without angina pectoris: Secondary | ICD-10-CM

## 2019-03-09 NOTE — Progress Notes (Signed)
S - Presents for f/u after had testosterone levels rechecked as he needed a refill on his test supp, which he gives himself at home weekly, 0.5cc. He has a h/o MI and had stent placed and was told by his cardiologist that maybe his low testosterone contributed to his MI. He had used anabolic steroids for many years in his past with that a likely contributor to his low testosterone. He has also had polycythemia (likley due to the supp and prior levels being very high (over 1000 at one point) and last check in Sept 2019 was 771. He denied any h/o sexual dysfcn with hypogonadism, no problems with erections and energy levels have been good. Also with increased lipids (low HDL)  Brought records of his blood donations, has done in Feb and April 2020 and Oct and Dec 2019 most recently tob - never smoker  Allergies  Allergen Reactions  . Penicillins     unknown  . Erythromycin Rash   Current Outpatient Medications on File Prior to Visit  Medication Sig Dispense Refill  . COCONUT OIL PO Take by mouth.    . Nutritional Supplements (DHEA PO) Take 1 tablet by mouth daily.    . Testosterone Cypionate 200 MG/ML SOLN Inject 0.5 mLs as directed once a week.    . ALPRAZolam (XANAX) 0.5 MG tablet Take 0.5 mg by mouth daily as needed for anxiety.    Marland Kitchen aspirin 81 MG chewable tablet Chew 1 tablet (81 mg total) by mouth daily.    . calcium carbonate (TUMS - DOSED IN MG ELEMENTAL CALCIUM) 500 MG chewable tablet Chew 1 tablet by mouth daily as needed for indigestion or heartburn.    Marland Kitchen ibuprofen (ADVIL,MOTRIN) 800 MG tablet Take 1 tablet (800 mg total) by mouth every 8 (eight) hours as needed for moderate pain. 15 tablet 0  . nitroGLYCERIN (NITROSTAT) 0.4 MG SL tablet Place 1 tablet (0.4 mg total) under the tongue every 5 (five) minutes as needed for chest pain (up to 3 doses). 25 tablet 3   No current facility-administered medications on file prior to visit.    O - NAD, masked BP 136/87 (BP Location: Right Arm,  Patient Position: Sitting, Cuff Size: Large)   Pulse 97   Temp 98 F (36.7 C) (Oral)   Resp 16   Ht 5\' 11"  (1.803 m)   Wt 226 lb (102.5 kg)   SpO2 96%   BMI 31.52 kg/m   HEENT - sclera anicteric Car - RRR without m/g/r Pulm -CTA Ext - no edema Affect not flat, approp with conversation  Last labs reviewed - H/H 15.4/48.1 0n 10/19, and 17.5/51.1 month prior (donated blood between), recent labs still pending - cbc and test/free test   A/P  1. Hypogonadism  Reviewed risk/benefits of testosterone supplementation and some more recent concerns with respect to this and that there is no widely accepted testosterone threshold that exists to which a level below that causes symptoms and adverse health outcomes. With the main goal of supplementation currently to help improve sexual function. Should be age approp ranges.  Last labs reviewed Current dose a concern as levels higher in normal ranges, one actually above and causing polycythemia which he manages with blood donations  Await labs still pending presently to help with rec'ed dose, but likely will be less than present dose and explained why I feel is best.   2. CAD - concern as cardiac risks are a factor with test supplementation and asked on his next f/u  with cardiology to get their opinion about this. Goal is preventing a second one presently  3. Polycythemia - likely due to test supp and overreplacement concern, noted the increased risks with this and why need to lessen test supp to manage this  4. Overweight - high BMI

## 2019-03-10 LAB — CBC WITH DIFFERENTIAL/PLATELET
Basophils Absolute: 0.1 10*3/uL (ref 0.0–0.2)
Basos: 1 %
EOS (ABSOLUTE): 0.5 10*3/uL — ABNORMAL HIGH (ref 0.0–0.4)
Eos: 7 %
Hematocrit: 54 % — ABNORMAL HIGH (ref 37.5–51.0)
Hemoglobin: 16.1 g/dL (ref 13.0–17.7)
Immature Grans (Abs): 0 10*3/uL (ref 0.0–0.1)
Immature Granulocytes: 0 %
Lymphocytes Absolute: 1.3 10*3/uL (ref 0.7–3.1)
Lymphs: 18 %
MCH: 24.2 pg — ABNORMAL LOW (ref 26.6–33.0)
MCHC: 29.8 g/dL — ABNORMAL LOW (ref 31.5–35.7)
MCV: 81 fL (ref 79–97)
Monocytes Absolute: 0.7 10*3/uL (ref 0.1–0.9)
Monocytes: 10 %
Neutrophils Absolute: 4.6 10*3/uL (ref 1.4–7.0)
Neutrophils: 64 %
Platelets: 247 10*3/uL (ref 150–450)
RBC: 6.64 x10E6/uL — ABNORMAL HIGH (ref 4.14–5.80)
RDW: 17.1 % — ABNORMAL HIGH (ref 11.6–15.4)
WBC: 7.2 10*3/uL (ref 3.4–10.8)

## 2019-03-10 LAB — TESTOSTERONE,FREE AND TOTAL
Testosterone, Free: 14.4 pg/mL (ref 7.2–24.0)
Testosterone: 450 ng/dL (ref 264–916)

## 2019-03-13 ENCOUNTER — Telehealth: Payer: Self-pay | Admitting: Internal Medicine

## 2019-03-13 ENCOUNTER — Other Ambulatory Visit: Payer: Self-pay | Admitting: Internal Medicine

## 2019-03-13 DIAGNOSIS — D751 Secondary polycythemia: Secondary | ICD-10-CM

## 2019-03-13 MED ORDER — TESTOSTERONE CYPIONATE 200 MG/ML IJ SOLN: 0.5000 mL | INTRAMUSCULAR | 3 refills | Status: DC

## 2019-03-13 NOTE — Progress Notes (Signed)
See result note.  

## 2019-03-13 NOTE — Progress Notes (Signed)
Called pt and scheduled for CBC on 03/24/19 at 8:15am.

## 2019-03-13 NOTE — Telephone Encounter (Signed)
All labs are resulted

## 2019-03-13 NOTE — Telephone Encounter (Signed)
He wants to know if his lab results have come in? He also wants someone to call him with the results.

## 2019-03-13 NOTE — Telephone Encounter (Signed)
Called patient and see result note.

## 2019-03-24 ENCOUNTER — Other Ambulatory Visit: Payer: Self-pay

## 2019-03-24 ENCOUNTER — Other Ambulatory Visit: Payer: 59

## 2019-03-24 ENCOUNTER — Other Ambulatory Visit: Payer: Self-pay | Admitting: Internal Medicine

## 2019-03-24 DIAGNOSIS — E291 Testicular hypofunction: Secondary | ICD-10-CM

## 2019-03-25 LAB — CBC WITH DIFFERENTIAL/PLATELET
Basophils Absolute: 0.1 10*3/uL (ref 0.0–0.2)
Basos: 1 %
EOS (ABSOLUTE): 0.6 10*3/uL — ABNORMAL HIGH (ref 0.0–0.4)
Eos: 8 %
Hematocrit: 44.8 % (ref 37.5–51.0)
Hemoglobin: 14.3 g/dL (ref 13.0–17.7)
Immature Grans (Abs): 0 10*3/uL (ref 0.0–0.1)
Immature Granulocytes: 0 %
Lymphocytes Absolute: 1.4 10*3/uL (ref 0.7–3.1)
Lymphs: 18 %
MCH: 24.6 pg — ABNORMAL LOW (ref 26.6–33.0)
MCHC: 31.9 g/dL (ref 31.5–35.7)
MCV: 77 fL — ABNORMAL LOW (ref 79–97)
Monocytes Absolute: 0.9 10*3/uL (ref 0.1–0.9)
Monocytes: 11 %
Neutrophils Absolute: 5 10*3/uL (ref 1.4–7.0)
Neutrophils: 62 %
Platelets: 269 10*3/uL (ref 150–450)
RBC: 5.81 x10E6/uL — ABNORMAL HIGH (ref 4.14–5.80)
RDW: 14.5 % (ref 11.6–15.4)
WBC: 8 10*3/uL (ref 3.4–10.8)

## 2019-03-27 ENCOUNTER — Telehealth: Payer: Self-pay

## 2019-03-27 ENCOUNTER — Other Ambulatory Visit: Payer: Self-pay | Admitting: Internal Medicine

## 2019-03-27 DIAGNOSIS — D751 Secondary polycythemia: Secondary | ICD-10-CM

## 2019-03-27 DIAGNOSIS — E291 Testicular hypofunction: Secondary | ICD-10-CM

## 2019-03-27 NOTE — Progress Notes (Signed)
See phone note

## 2019-03-27 NOTE — Telephone Encounter (Signed)
Pt aware and made lab visits.

## 2019-03-27 NOTE — Telephone Encounter (Signed)
-----   Message from Towanda Malkin, MD sent at 03/27/2019  8:52 AM EDT ----- The patient is not on MyChart. Please let him know the blood counts are better presently. Can continue the testosterone supplementation every other week as recommended (decreased from every week).  Need to recheck the CBC with diff in 6 weeks, and then the testosterone and free testosterone with a CBC with diff in 3 months.

## 2019-05-17 ENCOUNTER — Other Ambulatory Visit: Payer: Self-pay

## 2019-05-17 ENCOUNTER — Ambulatory Visit: Payer: 59

## 2019-05-17 DIAGNOSIS — Z01818 Encounter for other preprocedural examination: Secondary | ICD-10-CM

## 2019-05-17 LAB — POCT URINALYSIS DIPSTICK
Bilirubin, UA: NEGATIVE
Blood, UA: NEGATIVE
Glucose, UA: NEGATIVE
Ketones, UA: NEGATIVE
Leukocytes, UA: NEGATIVE
Nitrite, UA: NEGATIVE
Protein, UA: NEGATIVE
Spec Grav, UA: 1.01 (ref 1.010–1.025)
Urobilinogen, UA: 0.2 E.U./dL
pH, UA: 6 (ref 5.0–8.0)

## 2019-05-18 ENCOUNTER — Telehealth: Payer: Self-pay

## 2019-05-18 LAB — CMP12+LP+TP+TSH+6AC+PSA+CBC…
ALT: 33 IU/L (ref 0–44)
AST: 28 IU/L (ref 0–40)
Albumin/Globulin Ratio: 2 (ref 1.2–2.2)
Albumin: 4.5 g/dL (ref 4.0–5.0)
Alkaline Phosphatase: 63 IU/L (ref 39–117)
BUN/Creatinine Ratio: 12 (ref 9–20)
BUN: 15 mg/dL (ref 6–24)
Basophils Absolute: 0.1 10*3/uL (ref 0.0–0.2)
Basos: 1 %
Bilirubin Total: 0.4 mg/dL (ref 0.0–1.2)
Calcium: 9.5 mg/dL (ref 8.7–10.2)
Chloride: 103 mmol/L (ref 96–106)
Chol/HDL Ratio: 6.3 ratio — ABNORMAL HIGH (ref 0.0–5.0)
Cholesterol, Total: 202 mg/dL — ABNORMAL HIGH (ref 100–199)
Creatinine, Ser: 1.29 mg/dL — ABNORMAL HIGH (ref 0.76–1.27)
EOS (ABSOLUTE): 0.6 10*3/uL — ABNORMAL HIGH (ref 0.0–0.4)
Eos: 10 %
Estimated CHD Risk: 1.3 times avg. — ABNORMAL HIGH (ref 0.0–1.0)
Free Thyroxine Index: 1.3 (ref 1.2–4.9)
GFR calc Af Amer: 74 mL/min/{1.73_m2} (ref >59)
GFR calc non Af Amer: 64 mL/min/{1.73_m2} (ref >59)
GGT: 28 IU/L (ref 0–65)
Globulin, Total: 2.3 g/dL (ref 1.5–4.5)
Glucose: 89 mg/dL (ref 65–99)
HDL: 32 mg/dL — ABNORMAL LOW (ref >39)
Hematocrit: 46.6 % (ref 37.5–51.0)
Hemoglobin: 14.5 g/dL (ref 13.0–17.7)
Immature Grans (Abs): 0 10*3/uL (ref 0.0–0.1)
Immature Granulocytes: 0 %
Iron: 26 ug/dL — ABNORMAL LOW (ref 38–169)
LDH: 176 IU/L (ref 121–224)
LDL Chol Calc (NIH): 152 mg/dL — ABNORMAL HIGH (ref 0–99)
Lymphocytes Absolute: 1.3 10*3/uL (ref 0.7–3.1)
Lymphs: 20 %
MCH: 23.7 pg — ABNORMAL LOW (ref 26.6–33.0)
MCHC: 31.1 g/dL — ABNORMAL LOW (ref 31.5–35.7)
MCV: 76 fL — ABNORMAL LOW (ref 79–97)
Monocytes Absolute: 0.7 10*3/uL (ref 0.1–0.9)
Monocytes: 11 %
Neutrophils Absolute: 3.6 10*3/uL (ref 1.4–7.0)
Neutrophils: 58 %
Phosphorus: 3 mg/dL (ref 2.8–4.1)
Platelets: 313 10*3/uL (ref 150–450)
Potassium: 4.6 mmol/L (ref 3.5–5.2)
Prostate Specific Ag, Serum: 1.9 ng/mL (ref 0.0–4.0)
RBC: 6.12 x10E6/uL — ABNORMAL HIGH (ref 4.14–5.80)
RDW: 14.8 % (ref 11.6–15.4)
Sodium: 139 mmol/L (ref 134–144)
T3 Uptake Ratio: 27 % (ref 24–39)
T4, Total: 4.8 ug/dL (ref 4.5–12.0)
TSH: 2.11 u[IU]/mL (ref 0.450–4.500)
Total Protein: 6.8 g/dL (ref 6.0–8.5)
Triglycerides: 100 mg/dL (ref 0–149)
Uric Acid: 3.7 mg/dL (ref 3.7–8.6)
VLDL Cholesterol Cal: 18 mg/dL (ref 5–40)
WBC: 6.3 10*3/uL (ref 3.4–10.8)

## 2019-05-18 NOTE — Telephone Encounter (Signed)
Pt needs to bring testosterone log before office visit. Called pt and he will bring log by 05/19/19

## 2019-05-25 ENCOUNTER — Ambulatory Visit: Payer: 59 | Admitting: Internal Medicine

## 2019-05-25 ENCOUNTER — Encounter: Payer: Self-pay | Admitting: Internal Medicine

## 2019-05-25 ENCOUNTER — Other Ambulatory Visit: Payer: Self-pay

## 2019-05-25 VITALS — BP 142/92 | HR 71 | Temp 97.5°F | Resp 14 | Ht 70.0 in | Wt 229.0 lb

## 2019-05-25 DIAGNOSIS — R7989 Other specified abnormal findings of blood chemistry: Secondary | ICD-10-CM | POA: Insufficient documentation

## 2019-05-25 DIAGNOSIS — D751 Secondary polycythemia: Secondary | ICD-10-CM | POA: Insufficient documentation

## 2019-05-25 DIAGNOSIS — I25810 Atherosclerosis of coronary artery bypass graft(s) without angina pectoris: Secondary | ICD-10-CM | POA: Insufficient documentation

## 2019-05-25 DIAGNOSIS — E291 Testicular hypofunction: Secondary | ICD-10-CM

## 2019-05-25 DIAGNOSIS — R945 Abnormal results of liver function studies: Secondary | ICD-10-CM

## 2019-05-25 DIAGNOSIS — R030 Elevated blood-pressure reading, without diagnosis of hypertension: Secondary | ICD-10-CM | POA: Insufficient documentation

## 2019-05-25 DIAGNOSIS — Z0100 Encounter for examination of eyes and vision without abnormal findings: Secondary | ICD-10-CM

## 2019-05-25 DIAGNOSIS — E6609 Other obesity due to excess calories: Secondary | ICD-10-CM | POA: Insufficient documentation

## 2019-05-25 DIAGNOSIS — Z024 Encounter for examination for driving license: Secondary | ICD-10-CM | POA: Insufficient documentation

## 2019-05-25 DIAGNOSIS — F411 Generalized anxiety disorder: Secondary | ICD-10-CM

## 2019-05-25 DIAGNOSIS — E7849 Other hyperlipidemia: Secondary | ICD-10-CM

## 2019-05-25 DIAGNOSIS — E611 Iron deficiency: Secondary | ICD-10-CM | POA: Insufficient documentation

## 2019-05-25 MED ORDER — ATORVASTATIN CALCIUM 20 MG PO TABS: 20.0000 mg | ORAL_TABLET | Freq: Every day | ORAL | 3 refills | Status: DC

## 2019-05-25 NOTE — Progress Notes (Signed)
Benjamin Carrillo  -48 y.o. male who presents for physical evaluation/f/u with testosterone supp and DOT evaluation  See DOT form completed and to be scanned into chart for detailed review of history and physical evaluation  As requested, he did supply a log of his testosterone injections he does at home with him today. He currently injects 0.50 cc every other week (reduced from weekly starting in June).  No specific complaints, denies any recent CP, palpitations, SOB, abdominal pains, change in bowel habits, dark/black stools, vision changes, recent fevers, or other Covid concerning sx'Benjamin Carrillo, no urinary frequency, no LE swelling, no increased joint pains. Notes a little more tired with lessening the testosterone supp, does not snore and  Epworth Scale - 0,  Stop- Bang questionnaire 3/8.  No h/o sleep apnea.  Had an MI with stent placed 08/2015. He was on Brillinta, baby Aspirin and Crestor noted with last year'Benjamin Carrillo DOT physical, with the Brillinta stopped by cardiology and the Crestor stopped as concern for his liver. His transaminases were noted to be mildly increased in his past and an ultrasound of abdomen done and unremarkable except for small hemangioma in spleen.   He noted he had a colonoscopy and upper endoscopy done within the past year and that it was ok with no polyps of CA concerns per patient.   No h/o HTN, noted up at 0400 to do work this am and had two cups of coffee and thinks contributing to his BP some  Exercise - staying more active with home inspections and some exercise at times Diet - notes trying to keep his weight controlled  Meds reviewed Current Outpatient Medications on File Prior to Visit  Medication Sig Dispense Refill  . ALPRAZolam (XANAX) 0.5 MG tablet Take 1 tablet (0.5 mg total) by mouth daily as needed. Use sparingly. 45 tablet 0  . aspirin 81 MG chewable tablet Chew 1 tablet (81 mg total) by mouth daily.    . COCONUT OIL PO Take by mouth.    . Glucosamine HCl (GLUCOSAMINE PO)  Take by mouth.    . Nutritional Supplements (DHEA PO) Take 1 tablet by mouth daily.    . Testosterone Cypionate 200 MG/ML SOLN Inject 0.5 mLs as directed every 14 (fourteen) days. 2.5 mL 3   No current facility-administered medications on file prior to visit.     Takes xanax once to twice a week for anxiety, and at night, not during day and note with any driving  Allergies  Allergen Reactions  . Penicillins     unknown  . Erythromycin Rash    Social History   Tobacco Use  Smoking Status Never Smoker  Smokeless Tobacco Never Used     Alcohol use - once/week at most, mixed drink, CAGE - neg  FH - M - COPD, D - CVA, Benjamin Carrillo - HD, GP'Benjamin Carrillo - CAD  O - NAD, masked  BP (!) 142/92 (BP Location: Right Arm, Patient Position: Sitting, Cuff Size: Large)   Pulse 71   Temp (!) 97.5 F (36.4 C) (Oral)   Resp 14   Ht 5\' 10"  (1.778 m)   Wt 229 lb (103.9 kg)   SpO2 98%   BMI 32.86 kg/m   BP Cuff used above felt too tight   BP repeated with larger cuff - 144/89 right, 124/87 left, manual by me - 136/88 left  Acuity - 20/20 left, right, both, horiz field of vision - 90 degrees Whisper test - >6 feet bilat  HEENT - sclera anicteric, PERRL,  EOMI, conj - non-inj'ed, No sinus tenderness, TM'Benjamin Carrillo clear with what could be visualized with mild cerumen in the canals bilat, pharynx clear Neck - supple, no adenopathy, no TM, carotids 2+ and = without bruits bilat, circumference - 17.5 in Car - RRR without m/g/r Pulm- CTA without wheeze or rales Abd - soft, NT, ND, BS+, no obvious HSM, no masses Back - no CVA tenderness Skin- no new lesions of concern on exposed areas, denied otherwise, scar waistline - left Ext - no LE edema, no active joints GU - no swelling in inguinal/suprapubic region, NT, no ing hernia on testing Neuro - affect was not flat, appropriate with conversation  Grossly non-focal with good strength on testing, sensation intact to LT in distal extremities, DTR'Benjamin Carrillo 2+ and = patella, Romberg  neg, no pronator drift, good balance on one foot, good finger to nose, good RAMs  Labs reviewed - Cr - 1.29 (1.39 9/19 labs), iron - 26, TC - 202, HDL - 32, LDL - 152, RBC - 6.12, H/H - 14.5 /46.6 (16.1/54 June 2020), PSA - 1.9,  Last testosterone/free - 450/14.4 in June 2020   Colonoscopy screening discussed and reviewed - had within past year noted by patient  Ass/Plan: 1. DOT Evaluation - forms completed and one year certificate given  Noted why one year given with CAD hx noted  2. Borderline BP increase without dx of HTN - feel BP fairly well controlled and has been in recent past noted.   Educated on goals for BP Will continue to monitor with follow-ups Diet and aerobic exercise importance with weight control importance noted as well  3.  Hyperlipidemia  Educated on the role of medications, risk/benefits and a statin re-started - Lipitor - 20mg  daily in pm rec'ed Discussed goal of LDL < 70 Importance of diet modifications and some regular aerobic exercise encouraged Above to help with weight control/weight loss also very important   4. Increased BMI/overweight  Importance of diet modifications and aerobic exercise emphasized as noted above  5. CAD - stable  Continued f/u with cardiology recommended and he hasn't recent past due to Covid concerns (has been over a year). Would cont to f/u with them and noted why best. Cont baby ASA daily and the added statin felt important as well  6. Anxiety - prn Xanax used to help intermittently, well controlled  Noted this medicine risk/benefit and concerns can adversely effect driving performance. Can use prn and encouraged sparingly and not to take if any driving planned and he noted only uses at night time when he does take as noted above  7. Hypogonadism - noted steroid induced concern in history   On testosterone supplementation and closely monitoring with SE of polycythemia concern Continuing every other week at present  8.  Polycythemia - likely secondary and due to testosterone supp  Educated today on this and concerns Continue to follow CBC'Benjamin Carrillo every 3 months Likely also iron deficient and this is somewhat protective of counts being even higher with test supp. Noted, noted may be contributing some to fatigue as well  9. Iron deficient - not anemic, possibly due to above  Will try to increase iron in his diet at present He noted he did just have an upper and lower scope done (in the past year) and was ok and he was not anxious to have any of this repeated presently. Denied any signs or sx'Benjamin Carrillo of concern to rush to repeating at present.  10. H/o increased ALT/AST - liver  function tests normal on recent check. Prior U/Benjamin Carrillo noted to be ok as well.   Noted this to patient and do feel statin benefit far exceeds any risk at present  F/u with CBC, testosterone and free testosterone in 3 months, and will check a BMP and FLP again in about 6 months at the latest as well. F/u sooner prn

## 2019-05-31 DIAGNOSIS — M9903 Segmental and somatic dysfunction of lumbar region: Secondary | ICD-10-CM | POA: Diagnosis not present

## 2019-05-31 DIAGNOSIS — M9902 Segmental and somatic dysfunction of thoracic region: Secondary | ICD-10-CM | POA: Diagnosis not present

## 2019-05-31 DIAGNOSIS — M531 Cervicobrachial syndrome: Secondary | ICD-10-CM | POA: Diagnosis not present

## 2019-05-31 DIAGNOSIS — M9901 Segmental and somatic dysfunction of cervical region: Secondary | ICD-10-CM | POA: Diagnosis not present

## 2019-05-31 DIAGNOSIS — M5387 Other specified dorsopathies, lumbosacral region: Secondary | ICD-10-CM | POA: Diagnosis not present

## 2019-06-09 DIAGNOSIS — M531 Cervicobrachial syndrome: Secondary | ICD-10-CM | POA: Diagnosis not present

## 2019-06-09 DIAGNOSIS — M9901 Segmental and somatic dysfunction of cervical region: Secondary | ICD-10-CM | POA: Diagnosis not present

## 2019-06-09 DIAGNOSIS — M5387 Other specified dorsopathies, lumbosacral region: Secondary | ICD-10-CM | POA: Diagnosis not present

## 2019-06-09 DIAGNOSIS — M9903 Segmental and somatic dysfunction of lumbar region: Secondary | ICD-10-CM | POA: Diagnosis not present

## 2019-06-09 DIAGNOSIS — M9902 Segmental and somatic dysfunction of thoracic region: Secondary | ICD-10-CM | POA: Diagnosis not present

## 2019-06-27 ENCOUNTER — Other Ambulatory Visit: Payer: Self-pay

## 2019-08-13 IMAGING — US US ABDOMEN COMPLETE
1 series · 14 of 25 positions shown · non-contrast
Comparison: CT abdomen and pelvis July 14, 2010

CLINICAL DATA: Elevated liver enzymes

EXAM:
ABDOMEN ULTRASOUND COMPLETE

[Series 1: us abdomen complete · 14 of 115 slices shown]
[im 1/115]
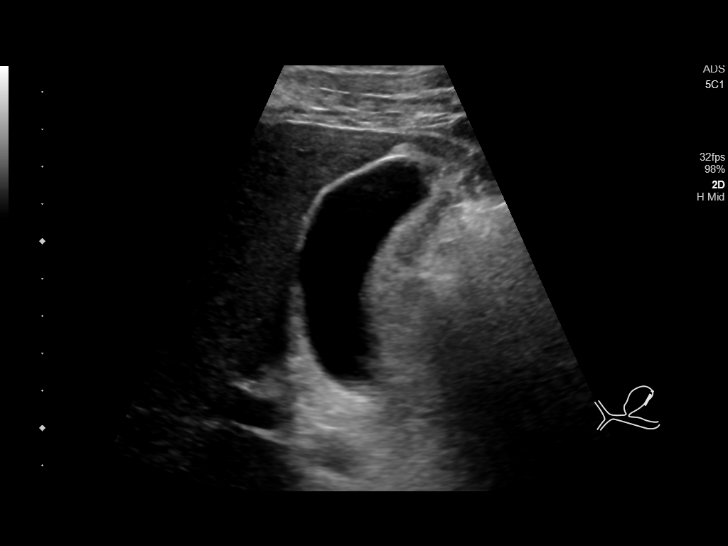
[im 10/115]
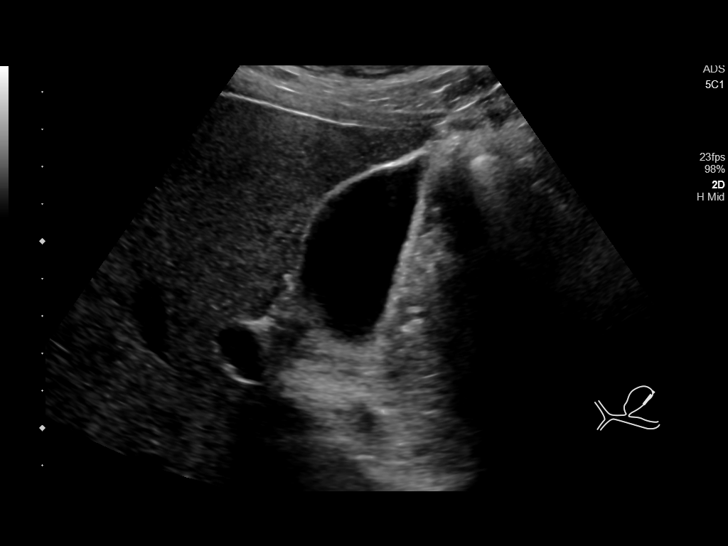
[im 20/115]
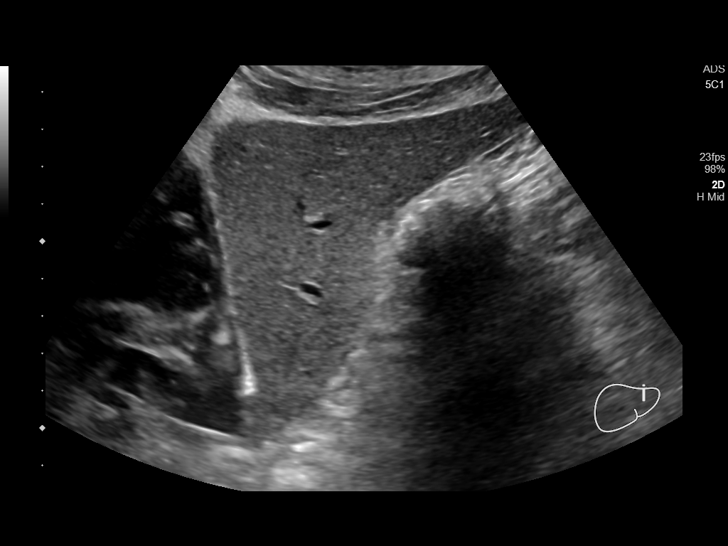
[im 29/115]
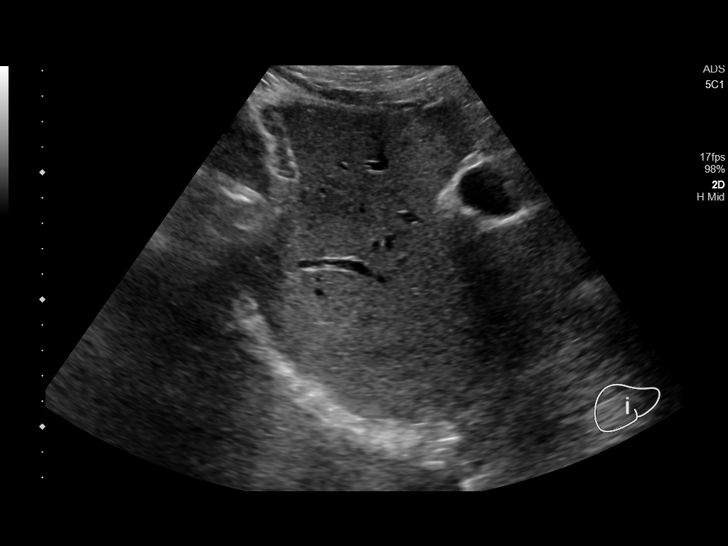
[im 39/115]
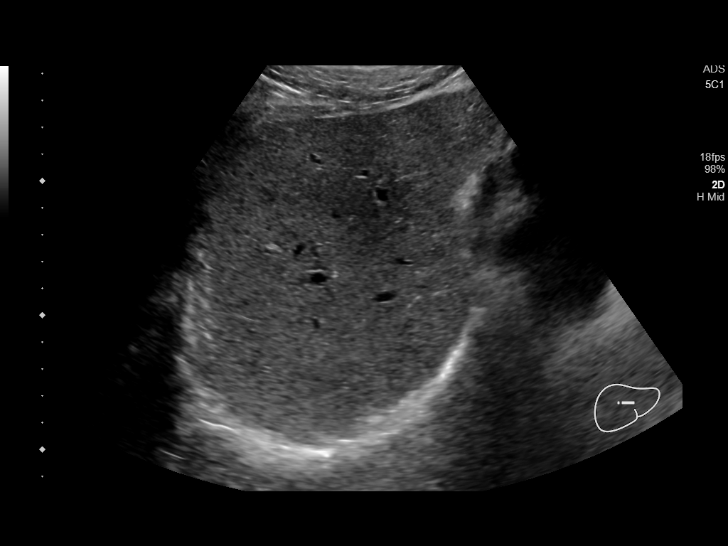
[im 43/115]
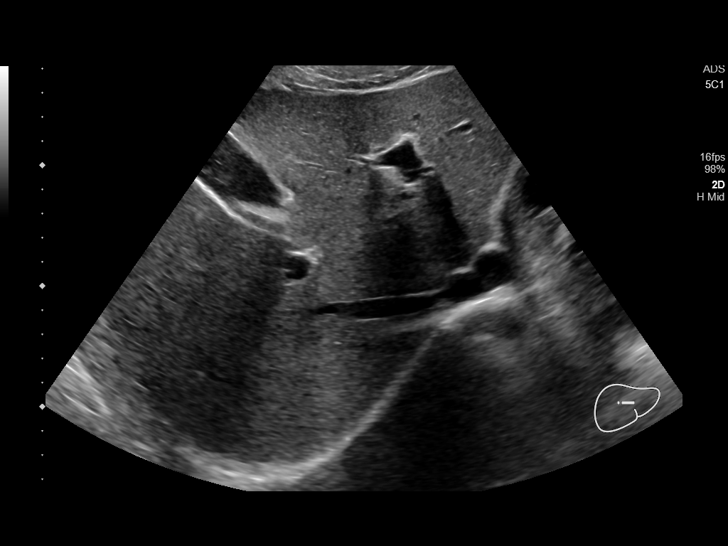
[im 53/115]
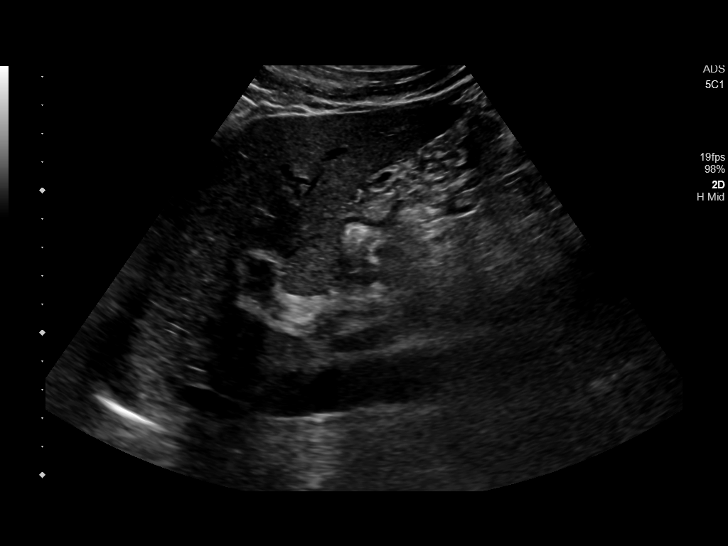
[im 62/115]
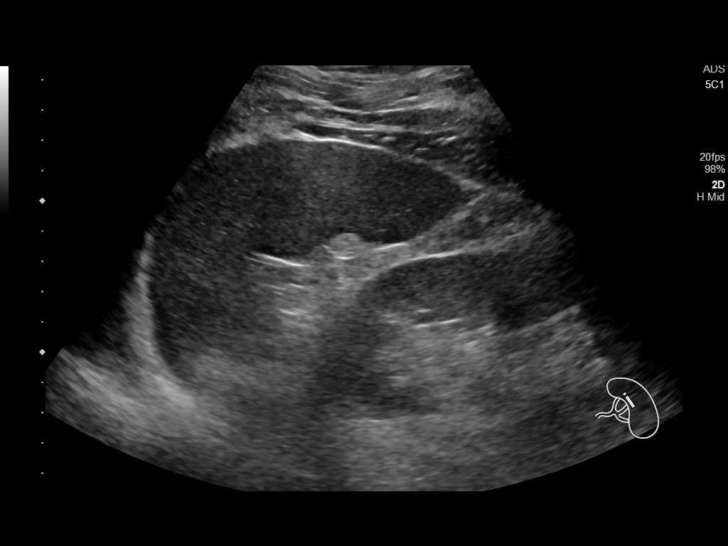
[im 72/115]
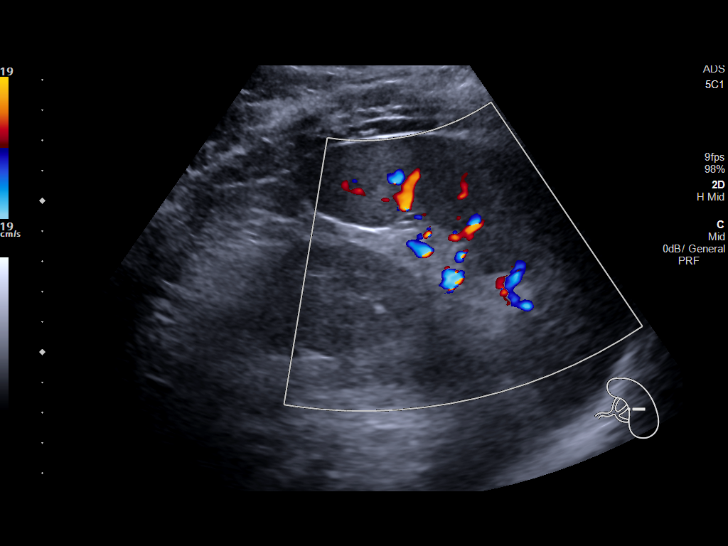
[im 77/115]
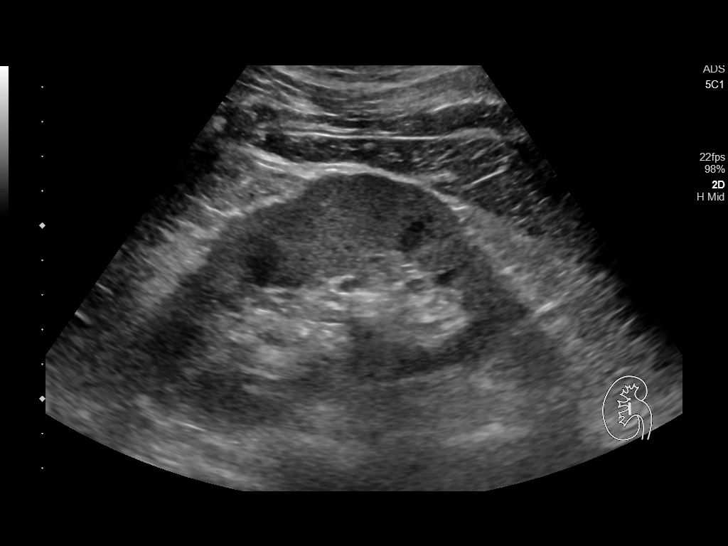
[im 86/115]
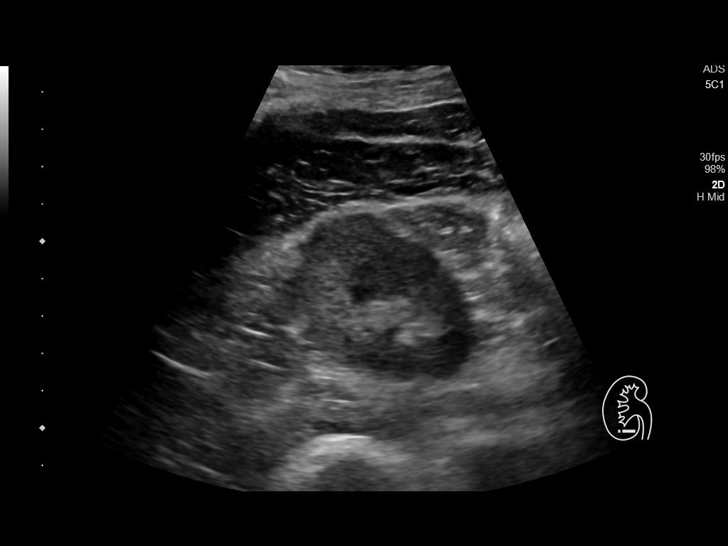
[im 96/115]
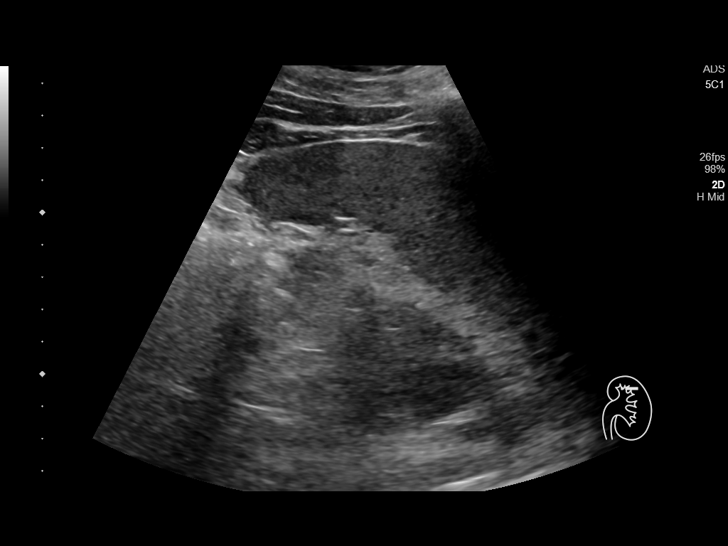
[im 105/115]
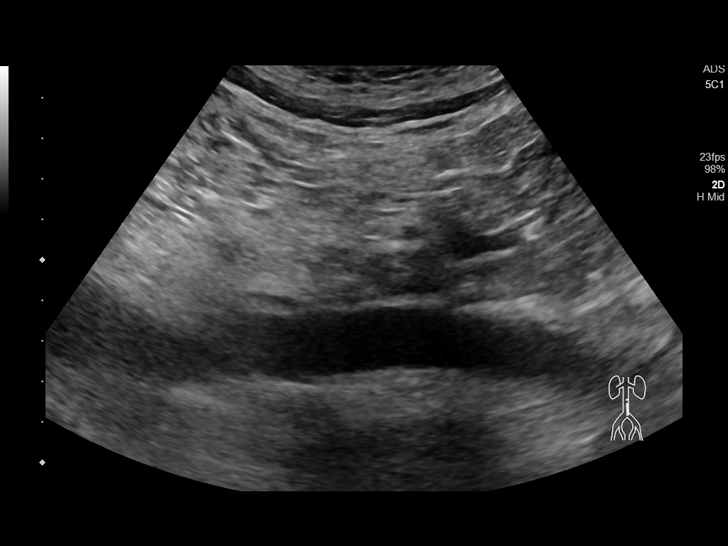
[im 115/115]
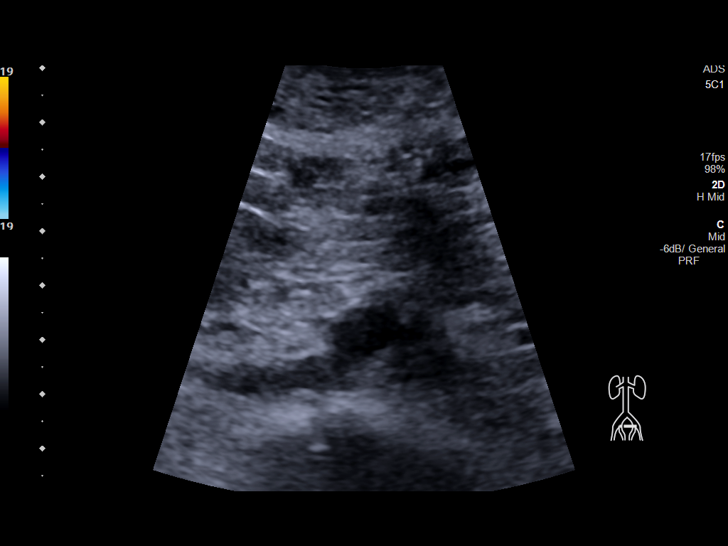

[14 of 25 positions shown; findings below may reference images not displayed]

FINDINGS: Gallbladder: No gallstones or wall thickening visualized. There is
no pericholecystic fluid. No sonographic Murphy sign noted by
sonographer.

Common bile duct: Diameter: 2 mm. No intrahepatic, common hepatic,
or common bile duct dilatation.

Liver: No focal lesion identified. Within normal limits in
parenchymal echogenicity. Portal vein is patent on color Doppler
imaging with normal direction of blood flow towards the liver.

IVC: No abnormality visualized.

Pancreas: No pancreatic mass or inflammatory focus evident.

Spleen: There is a small echogenic mass in the inferior spleen
measuring 0.8 x 0.7 x 0.7 cm. Spleen otherwise appears unremarkable.

Right Kidney: Length: 12.0 cm. Echogenicity within normal limits. No
mass or hydronephrosis visualized.

Left Kidney: Length: 12.5 cm. Echogenicity within normal limits. No
mass or hydronephrosis visualized.

Abdominal aorta: No aneurysm visualized.

Other findings: No demonstrable ascites.
IMPRESSION: Small apparent hemangioma in the spleen. Study otherwise
unremarkable.

## 2019-08-17 ENCOUNTER — Other Ambulatory Visit: Payer: 59

## 2019-08-17 ENCOUNTER — Other Ambulatory Visit: Payer: Self-pay

## 2019-08-17 DIAGNOSIS — E291 Testicular hypofunction: Secondary | ICD-10-CM

## 2019-08-17 NOTE — Progress Notes (Signed)
Scheduled to review lab results 08/24/2019 with Laurey Morale, PA-C (Interim Provider).  05/25/2019  - Dr. Roxan Hockey recommended 6 month F/U labs of BMP & FLP.  This will be due 11/2019.  AMD

## 2019-08-19 LAB — CBC WITH DIFFERENTIAL/PLATELET
Basophils Absolute: 0.1 10*3/uL (ref 0.0–0.2)
Basos: 1 %
EOS (ABSOLUTE): 0.4 10*3/uL (ref 0.0–0.4)
Eos: 8 %
Hematocrit: 45.7 % (ref 37.5–51.0)
Hemoglobin: 13.8 g/dL (ref 13.0–17.7)
Immature Grans (Abs): 0 10*3/uL (ref 0.0–0.1)
Immature Granulocytes: 0 %
Lymphocytes Absolute: 1.1 10*3/uL (ref 0.7–3.1)
Lymphs: 21 %
MCH: 22.9 pg — ABNORMAL LOW (ref 26.6–33.0)
MCHC: 30.2 g/dL — ABNORMAL LOW (ref 31.5–35.7)
MCV: 76 fL — ABNORMAL LOW (ref 79–97)
Monocytes Absolute: 0.5 10*3/uL (ref 0.1–0.9)
Monocytes: 9 %
Neutrophils Absolute: 3.2 10*3/uL (ref 1.4–7.0)
Neutrophils: 61 %
Platelets: 265 10*3/uL (ref 150–450)
RBC: 6.03 x10E6/uL — ABNORMAL HIGH (ref 4.14–5.80)
RDW: 14.6 % (ref 11.6–15.4)
WBC: 5.3 10*3/uL (ref 3.4–10.8)

## 2019-08-19 LAB — TESTOSTERONE,FREE AND TOTAL
Testosterone, Free: 8.5 pg/mL (ref 7.2–24.0)
Testosterone: 319 ng/dL (ref 264–916)

## 2019-08-24 ENCOUNTER — Other Ambulatory Visit: Payer: Self-pay

## 2019-08-24 ENCOUNTER — Encounter: Payer: Self-pay | Admitting: Physician Assistant

## 2019-08-24 ENCOUNTER — Ambulatory Visit: Payer: 59 | Admitting: Physician Assistant

## 2019-08-24 VITALS — BP 120/74 | HR 82 | Temp 98.7°F | Resp 12 | Ht 70.0 in | Wt 228.0 lb

## 2019-08-24 DIAGNOSIS — R7989 Other specified abnormal findings of blood chemistry: Secondary | ICD-10-CM

## 2019-09-04 NOTE — Progress Notes (Signed)
48 yo M returns for review of labs Saw Dr Roxan Hockey  05/25/19  MI 2016 with stent Brillinta Rx stopped by cardiology Crestor stopped out of concern for liver- labs mildly up - u/s negative except small hemangioma spleen Hx HTN Watching weight and exercising  He takes testosterone  .05 every other week since it was reduced in June by Dr Wyline Copas 0;Stop-Bang 3/8  , denies snore or apnea Concerned about losing bulking with reduction of Testosterone replacement . 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 Given talk time to ventilate, recognized it is frustrating for all   Labs reviewed   Hgb 13.8 Hct 45.7 Has done blood donations in the past to maintain nml levels  06/04/19   Wt 229     5'10"   142/92   Today           228                 120/74   Test 319    Test free   8.5   Disposition- Continue medications as recently ordered. Recommendation to Lose a few pounds with healthy food choices, portion control  Supported with exercise program and walking  He asked about a consult for testosterone therapy and is encouraged   To see the  Urologist of his choosing for their advice. We will assist as needed and if a protocol for care is developed by consultant with labs and such. We can help coordinate the consult if he wishes.  RTC 3 months or per consult

## 2019-10-11 DIAGNOSIS — M542 Cervicalgia: Secondary | ICD-10-CM | POA: Diagnosis not present

## 2019-10-11 DIAGNOSIS — M9903 Segmental and somatic dysfunction of lumbar region: Secondary | ICD-10-CM | POA: Diagnosis not present

## 2019-10-11 DIAGNOSIS — M9904 Segmental and somatic dysfunction of sacral region: Secondary | ICD-10-CM | POA: Diagnosis not present

## 2019-10-11 DIAGNOSIS — M5387 Other specified dorsopathies, lumbosacral region: Secondary | ICD-10-CM | POA: Diagnosis not present

## 2019-10-11 DIAGNOSIS — M9902 Segmental and somatic dysfunction of thoracic region: Secondary | ICD-10-CM | POA: Diagnosis not present

## 2019-10-11 DIAGNOSIS — M9901 Segmental and somatic dysfunction of cervical region: Secondary | ICD-10-CM | POA: Diagnosis not present

## 2019-10-18 ENCOUNTER — Other Ambulatory Visit: Payer: Self-pay

## 2019-10-18 DIAGNOSIS — F419 Anxiety disorder, unspecified: Secondary | ICD-10-CM

## 2019-10-18 MED ORDER — ALPRAZOLAM 0.5 MG PO TABS: 0.5000 mg | ORAL_TABLET | Freq: Every day | ORAL | 0 refills | Status: DC | PRN

## 2019-10-18 NOTE — Telephone Encounter (Signed)
Alprazolam last filled by Dr Roxan Hockey 7 months ago per PMP website review.  I filled in 2019 last.  Last office visit with PA Truman Hayward 08/24/2019 and DOT Dr Roxan Hockey Sep 2020 " Anxiety - prn Xanax used to help intermittently, well controlled Noted this medicine risk/benefit and concerns can adversely effect driving performance. Can use prn and encouraged sparingly and not to take if any driving planned and he noted only uses at night time when he does take as noted above"  Labs 05/17/2019 LFTs WNL  Electronic Rx to his pharmacy of choice alprazolam 0.5mg  po daily prn anxiety do not take before driving [id number] will need face to face visit after 11/22/2019 if another refill needed.

## 2019-10-19 NOTE — Telephone Encounter (Signed)
Patient is scheduled for OV on 11/13/2019.

## 2019-11-13 ENCOUNTER — Ambulatory Visit: Payer: Self-pay

## 2019-11-16 ENCOUNTER — Other Ambulatory Visit: Payer: Self-pay

## 2019-11-16 ENCOUNTER — Ambulatory Visit: Payer: Self-pay | Admitting: Physician Assistant

## 2019-11-16 DIAGNOSIS — F419 Anxiety disorder, unspecified: Secondary | ICD-10-CM

## 2019-11-16 MED ORDER — ALPRAZOLAM 0.5 MG PO TABS: 0.5000 mg | ORAL_TABLET | Freq: Every day | ORAL | 0 refills | Status: DC | PRN

## 2019-11-16 MED ORDER — TRAMADOL HCL 50 MG PO TABS: 50.0000 mg | ORAL_TABLET | Freq: Four times a day (QID) | ORAL | 0 refills | Status: DC | PRN

## 2019-11-16 NOTE — Progress Notes (Signed)
   Subjective: Medication refill    Patient ID: Benjamin Carrillo, male    DOB: 1971/04/07, 49 y.o.   MRN: [id number]  HPI Patient presents with medication refill request for Xanax and tramadol.  Patient state he take these as a as needed basis.  Last prescription for Xanax was 1 month ago.  Last prescription for tramadol was greater than 3 months.   Review of Systems Anxiety  chronic back pain.    Objective:   Physical Exam Deferred.       Assessment & Plan: Medication refill  Reviewed narcotic revealing compliance with medication as prescribed.  Prescription refilled patient advised to follow-up monthly until his clinic establish a PCP.

## 2019-11-30 ENCOUNTER — Encounter: Payer: Self-pay | Admitting: Internal Medicine

## 2019-11-30 ENCOUNTER — Ambulatory Visit (INDEPENDENT_AMBULATORY_CARE_PROVIDER_SITE_OTHER): Payer: 59 | Admitting: Internal Medicine

## 2019-11-30 ENCOUNTER — Other Ambulatory Visit: Payer: Self-pay

## 2019-11-30 VITALS — BP 130/84 | HR 76 | Temp 98.7°F | Ht 69.5 in | Wt 227.0 lb

## 2019-11-30 DIAGNOSIS — F411 Generalized anxiety disorder: Secondary | ICD-10-CM

## 2019-11-30 DIAGNOSIS — R69 Illness, unspecified: Secondary | ICD-10-CM | POA: Diagnosis not present

## 2019-11-30 DIAGNOSIS — E291 Testicular hypofunction: Secondary | ICD-10-CM | POA: Diagnosis not present

## 2019-11-30 DIAGNOSIS — I2510 Atherosclerotic heart disease of native coronary artery without angina pectoris: Secondary | ICD-10-CM | POA: Diagnosis not present

## 2019-11-30 DIAGNOSIS — I2111 ST elevation (STEMI) myocardial infarction involving right coronary artery: Secondary | ICD-10-CM

## 2019-11-30 DIAGNOSIS — M48061 Spinal stenosis, lumbar region without neurogenic claudication: Secondary | ICD-10-CM

## 2019-11-30 DIAGNOSIS — E7849 Other hyperlipidemia: Secondary | ICD-10-CM

## 2019-11-30 DIAGNOSIS — F419 Anxiety disorder, unspecified: Secondary | ICD-10-CM | POA: Diagnosis not present

## 2019-11-30 NOTE — Assessment & Plan Note (Signed)
No angina Continue Atorvastatin and ASA

## 2019-11-30 NOTE — Progress Notes (Signed)
HPI  Pt presents to the clinic today to establish care and for management of the conditions listed below. He has been seeing the [facility] when needed.  Anxiety: Situational. He takes Xanax about 3 times per week. He denies depression, SI/HI.  HLD with CAD s/p MI: His last LDL was 40, 05/2018. He denies myalgias on Atorvastatin. He is taking ASA as well. He follows with cardiology.  Hypogonadism: He gives himself Testosterone injections. He does not follow with urology.  HTN: His BP today is 130/84. He is not taking an antihypertensive medication at this time.  IBS: Mainly diarrhea. Improved with Fish Oil OTC.  Lumbar Spinal Stenosis: He sees a Restaurant manager, fast food. He takes Tylenol and Tramadol a few times per month with some relief. MRI lumbar from 2006 reviewed.  Flu: 09/2019 Tetanus: 2016 PSA Screening: Colon Screening: 08/2018 Vision Screening: annually Dentist: annually  Past Medical History:  Diagnosis Date  . Anabolic steroid abuse 99991111  . CAD (coronary artery disease)    a. 09/08/15: inferior STEMI s/p DES of mid RCA  . Gastritis   . HLD (hyperlipidemia)   . 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 IBS (irritable bowel syndrome)   . Low HDL (under 40)   . Low testosterone   . MI (myocardial infarction) ([place])   . Pinched nerve    L4  . Polycythemia     Current Outpatient Medications  Medication Sig Dispense Refill  . ALPRAZolam (XANAX) 0.5 MG tablet Take 1 tablet (0.5 mg total) by mouth daily as needed. Use sparingly. 45 tablet 0  . aspirin 81 MG chewable tablet Chew 1 tablet (81 mg total) by mouth daily.    Marland Kitchen atorvastatin (LIPITOR) 20 MG tablet Take 1 tablet (20 mg total) by mouth daily. 90 tablet 3  . COCONUT OIL PO Take by mouth.    . Glucosamine HCl (GLUCOSAMINE PO) Take by mouth.    . Nutritional Supplements (DHEA PO) Take 1 tablet by mouth daily.    Marland Kitchen QUERCETIN PO Take by mouth.    . Testosterone Cypionate 200 MG/ML SOLN Inject 0.5 mLs as  directed every 14 (fourteen) days. 2.5 mL 3  . traMADol (ULTRAM) 50 MG tablet Take 1 tablet (50 mg total) by mouth every 6 (six) hours as needed. 20 tablet 0   No current facility-administered medications for this visit.    Allergies  Allergen Reactions  . Penicillins     unknown  . Erythromycin Rash    Family History  Problem Relation Age of Onset  . CAD Maternal Grandfather   . CAD Paternal Grandfather   . COPD Mother   . Stroke Father   . Heart disease Sister     Social History   Socioeconomic History  . Marital status: Married    Spouse name: Not on file  . Number of children: Not on file  . Years of education: Not on file  . Highest education level: Not on file  Occupational History  . Occupation: Airline pilot  Tobacco Use  . Smoking status: Never Smoker  . Smokeless tobacco: Never Used  Substance and Sexual Activity  . Alcohol use: Yes    Alcohol/week: 0.0 standard drinks    Comment: Rare  . Drug use: No  . Sexual activity: Not on file  Other Topics Concern  . Not on file  Social History Narrative  . Not on file   Social Determinants of Health   Financial Resource Strain:   . Difficulty of  Paying Living Expenses:   Food Insecurity:   . Worried About Charity fundraiser in the Last Year:   . Arboriculturist in the Last Year:   Transportation Needs:   . Film/video editor (Medical):   Marland Kitchen Lack of Transportation (Non-Medical):   Physical Activity:   . Days of Exercise per Week:   . Minutes of Exercise per Session:   Stress:   . Feeling of Stress :   Social Connections:   . Frequency of Communication with Friends and Family:   . Frequency of Social Gatherings with Friends and Family:   . Attends Religious Services:   . Active Member of Clubs or Organizations:   . Attends Archivist Meetings:   Marland Kitchen Marital Status:   Intimate Partner Violence:   . Fear of Current or Ex-Partner:   . Emotionally Abused:   Marland Kitchen Physically Abused:   . Sexually  Abused:     ROS:  Constitutional: Denies fever, malaise, fatigue, headache or abrupt weight changes.  HEENT: Denies eye pain, eye redness, ear pain, ringing in the ears, wax buildup, runny nose, nasal congestion, bloody nose, or sore throat. Respiratory: Denies difficulty breathing, shortness of breath, cough or sputum production.   Cardiovascular: Denies chest pain, chest tightness, palpitations or swelling in the hands or feet.  Gastrointestinal: Denies abdominal pain, bloating, constipation, diarrhea or blood in the stool.  GU: Denies frequency, urgency, pain with urination, blood in urine, odor or discharge. Musculoskeletal: Pt reports intermittent low back pain. Denies decrease in range of motion, difficulty with gait, or joint swelling.  Skin: Denies redness, rashes, lesions or ulcercations.  Neurological: Denies dizziness, difficulty with memory, difficulty with speech or problems with balance and coordination.  Psych: Pt has a history of anxiety. Denies depression, SI/HI.  No other specific complaints in a complete review of systems (except as listed in HPI above).  PE:  BP 130/84   Pulse 76   Temp 98.7 F (37.1 C) (Temporal)   Ht 5' 9.5" (1.765 m)   Wt 227 lb (103 kg)   SpO2 98%   BMI 33.04 kg/m   Wt Readings from Last 3 Encounters:  11/16/19 230 lb 9.6 oz (104.6 kg)  08/24/19 228 lb (103.4 kg)  05/25/19 229 lb (103.9 kg)    General: Appears his stated age, obese, in NAD. HEENT: Head: normal shape and size; Eyes: sclera white, no icterus, conjunctiva pink, PERRLA and EOMs intact; Neck: Neck supple, trachea midline. No masses, lumps or thyromegaly present.  Cardiovascular: Normal rate and rhythm. S1,S2 noted.  No murmur, rubs or gallops noted. No JVD or BLE edema. No carotid bruits noted. Pulmonary/Chest: Normal effort and positive vesicular breath sounds. No respiratory distress. No wheezes, rales or ronchi noted.  Musculoskeletal: No difficulty with gait.   Neurological: Alert and oriented.  Psychiatric: Mildly anxious appearing. Behavior is normal. Judgment and thought content normal.     BMET    Component Value Date/Time   NA 139 05/17/2019 0847   K 4.6 05/17/2019 0847   CL 103 05/17/2019 0847   CO2 27 10/22/2015 0835   GLUCOSE 89 05/17/2019 0847   GLUCOSE 76 10/22/2015 0835   BUN 15 05/17/2019 0847   CREATININE 1.29 (H) 05/17/2019 0847   CREATININE 1.24 10/22/2015 0835   CALCIUM 9.5 05/17/2019 0847   GFRNONAA 64 05/17/2019 0847   GFRAA 74 05/17/2019 0847    Lipid Panel     Component Value Date/Time   CHOL 202 (H)  05/17/2019 0847   TRIG 100 05/17/2019 0847   HDL 32 (L) 05/17/2019 0847   CHOLHDL 6.3 (H) 05/17/2019 0847   CHOLHDL 2.3 10/22/2015 0835   VLDL 11 10/22/2015 0835   LDLCALC 152 (H) 05/17/2019 0847    CBC    Component Value Date/Time   WBC 5.3 08/17/2019 0921   WBC 13.4 (H) 09/09/2015 0423   RBC 6.03 (H) 08/17/2019 0921   RBC 5.64 09/09/2015 0423   HGB 13.8 08/17/2019 0921   HCT 45.7 08/17/2019 0921   PLT 265 08/17/2019 0921   MCV 76 (L) 08/17/2019 0921   MCH 22.9 (L) 08/17/2019 0921   MCH 30.3 09/09/2015 0423   MCHC 30.2 (L) 08/17/2019 0921   MCHC 33.5 09/09/2015 0423   RDW 14.6 08/17/2019 0921   LYMPHSABS 1.1 08/17/2019 0921   EOSABS 0.4 08/17/2019 0921   BASOSABS 0.1 08/17/2019 0921    Hgb A1C Lab Results  Component Value Date   HGBA1C 5.6 09/08/2015     Assessment and Plan:

## 2019-11-30 NOTE — Assessment & Plan Note (Signed)
Continue Atorvastatin Consume a low fat diet 

## 2019-11-30 NOTE — Assessment & Plan Note (Signed)
Continue Atorvastatin and ASA Discussed possible need of adding antihypertensives, but will monitor for now

## 2019-11-30 NOTE — Patient Instructions (Signed)
Fat and Cholesterol Restricted Eating Plan Getting too much fat and cholesterol in your diet may cause health problems. Choosing the right foods helps keep your fat and cholesterol at normal levels. This can keep you from getting certain diseases. Your doctor may recommend an eating plan that includes:  Total fat: ______% or less of total calories a day.  Saturated fat: ______% or less of total calories a day.  Cholesterol: less than _________mg a day.  Fiber: ______g a day. What are tips for following this plan? Meal planning  At meals, divide your plate into four equal parts: ? Fill one-half of your plate with vegetables and green salads. ? Fill one-fourth of your plate with whole grains. ? Fill one-fourth of your plate with low-fat (lean) protein foods.  Eat fish that is high in omega-3 fats at least two times a week. This includes mackerel, tuna, sardines, and salmon.  Eat foods that are high in fiber, such as whole grains, beans, apples, broccoli, carrots, peas, and barley. General tips   Work with your doctor to lose weight if you need to.  Avoid: ? Foods with added sugar. ? Fried foods. ? Foods with partially hydrogenated oils.  Limit alcohol intake to no more than 1 drink a day for nonpregnant women and 2 drinks a day for men. One drink equals 12 oz of beer, 5 oz of wine, or 1 oz of hard liquor. Reading food labels  Check food labels for: ? Trans fats. ? Partially hydrogenated oils. ? Saturated fat (g) in each serving. ? Cholesterol (mg) in each serving. ? Fiber (g) in each serving.  Choose foods with healthy fats, such as: ? Monounsaturated fats. ? Polyunsaturated fats. ? Omega-3 fats.  Choose grain products that have whole grains. Look for the word "whole" as the first word in the ingredient list. Cooking  Cook foods using low-fat methods. These include baking, boiling, grilling, and broiling.  Eat more home-cooked foods. Eat at restaurants and buffets  less often.  Avoid cooking using saturated fats, such as butter, cream, palm oil, palm kernel oil, and coconut oil. Recommended foods  Fruits  All fresh, canned (in natural juice), or frozen fruits. Vegetables  Fresh or frozen vegetables (raw, steamed, roasted, or grilled). Green salads. Grains  Whole grains, such as whole wheat or whole grain breads, crackers, cereals, and pasta. Unsweetened oatmeal, bulgur, barley, quinoa, or brown rice. Corn or whole wheat flour tortillas. Meats and other protein foods  Ground beef (85% or leaner), grass-fed beef, or beef trimmed of fat. Skinless chicken or turkey. Ground chicken or turkey. Pork trimmed of fat. All fish and seafood. Egg whites. Dried beans, peas, or lentils. Unsalted nuts or seeds. Unsalted canned beans. Nut butters without added sugar or oil. Dairy  Low-fat or nonfat dairy products, such as skim or 1% milk, 2% or reduced-fat cheeses, low-fat and fat-free ricotta or cottage cheese, or plain low-fat and nonfat yogurt. Fats and oils  Tub margarine without trans fats. Light or reduced-fat mayonnaise and salad dressings. Avocado. Olive, canola, sesame, or safflower oils. The items listed above may not be a complete list of foods and beverages you can eat. Contact a dietitian for more information. Foods to avoid Fruits  Canned fruit in heavy syrup. Fruit in cream or butter sauce. Fried fruit. Vegetables  Vegetables cooked in cheese, cream, or butter sauce. Fried vegetables. Grains  White bread. White pasta. White rice. Cornbread. Bagels, pastries, and croissants. Crackers and snack foods that contain trans fat   and hydrogenated oils. Meats and other protein foods  Fatty cuts of meat. Ribs, chicken wings, bacon, sausage, bologna, salami, chitterlings, fatback, hot dogs, bratwurst, and packaged lunch meats. Liver and organ meats. Whole eggs and egg yolks. Chicken and turkey with skin. Fried meat. Dairy  Whole or 2% milk, cream,  half-and-half, and cream cheese. Whole milk cheeses. Whole-fat or sweetened yogurt. Full-fat cheeses. Nondairy creamers and whipped toppings. Processed cheese, cheese spreads, and cheese curds. Beverages  Alcohol. Sugar-sweetened drinks such as sodas, lemonade, and fruit drinks. Fats and oils  Butter, stick margarine, lard, shortening, ghee, or bacon fat. Coconut, palm kernel, and palm oils. Sweets and desserts  Corn syrup, sugars, honey, and molasses. Candy. Jam and jelly. Syrup. Sweetened cereals. Cookies, pies, cakes, donuts, muffins, and ice cream. The items listed above may not be a complete list of foods and beverages you should avoid. Contact a dietitian for more information. Summary  Choosing the right foods helps keep your fat and cholesterol at normal levels. This can keep you from getting certain diseases.  At meals, fill one-half of your plate with vegetables and green salads.  Eat high-fiber foods, like whole grains, beans, apples, carrots, peas, and barley.  Limit added sugar, saturated fats, alcohol, and fried foods. This information is not intended to replace advice given to you by your health care provider. Make sure you discuss any questions you have with your health care provider. Document Revised: 05/04/2018 Document Reviewed: 05/18/2017 Elsevier Patient Education  2020 Elsevier Inc.  

## 2019-11-30 NOTE — Assessment & Plan Note (Signed)
Advised him to use Tylenol before Tramadol Continue to see the chiropractor

## 2019-11-30 NOTE — Assessment & Plan Note (Signed)
Will refer to urology for further management

## 2019-11-30 NOTE — Assessment & Plan Note (Signed)
Discussed possibility of adding daily preventative medication if symptoms worse Continue Xanax as needed Will obtain CSA and UDS at next visit, if he wants me to manage this RX

## 2019-12-28 DIAGNOSIS — M545 Low back pain: Secondary | ICD-10-CM | POA: Diagnosis not present

## 2019-12-28 DIAGNOSIS — M9903 Segmental and somatic dysfunction of lumbar region: Secondary | ICD-10-CM | POA: Diagnosis not present

## 2019-12-28 DIAGNOSIS — M9904 Segmental and somatic dysfunction of sacral region: Secondary | ICD-10-CM | POA: Diagnosis not present

## 2019-12-28 DIAGNOSIS — M542 Cervicalgia: Secondary | ICD-10-CM | POA: Diagnosis not present

## 2019-12-28 DIAGNOSIS — M9902 Segmental and somatic dysfunction of thoracic region: Secondary | ICD-10-CM | POA: Diagnosis not present

## 2019-12-28 DIAGNOSIS — M9901 Segmental and somatic dysfunction of cervical region: Secondary | ICD-10-CM | POA: Diagnosis not present

## 2020-01-01 DIAGNOSIS — M9904 Segmental and somatic dysfunction of sacral region: Secondary | ICD-10-CM | POA: Diagnosis not present

## 2020-01-01 DIAGNOSIS — M9903 Segmental and somatic dysfunction of lumbar region: Secondary | ICD-10-CM | POA: Diagnosis not present

## 2020-01-01 DIAGNOSIS — M542 Cervicalgia: Secondary | ICD-10-CM | POA: Diagnosis not present

## 2020-01-01 DIAGNOSIS — M545 Low back pain: Secondary | ICD-10-CM | POA: Diagnosis not present

## 2020-01-01 DIAGNOSIS — M9901 Segmental and somatic dysfunction of cervical region: Secondary | ICD-10-CM | POA: Diagnosis not present

## 2020-01-01 DIAGNOSIS — M9902 Segmental and somatic dysfunction of thoracic region: Secondary | ICD-10-CM | POA: Diagnosis not present

## 2020-01-08 ENCOUNTER — Other Ambulatory Visit: Payer: Self-pay | Admitting: Physician Assistant

## 2020-01-09 DIAGNOSIS — M531 Cervicobrachial syndrome: Secondary | ICD-10-CM | POA: Diagnosis not present

## 2020-01-09 DIAGNOSIS — M9904 Segmental and somatic dysfunction of sacral region: Secondary | ICD-10-CM | POA: Diagnosis not present

## 2020-01-09 DIAGNOSIS — M9903 Segmental and somatic dysfunction of lumbar region: Secondary | ICD-10-CM | POA: Diagnosis not present

## 2020-01-09 DIAGNOSIS — M9902 Segmental and somatic dysfunction of thoracic region: Secondary | ICD-10-CM | POA: Diagnosis not present

## 2020-01-09 DIAGNOSIS — M545 Low back pain: Secondary | ICD-10-CM | POA: Diagnosis not present

## 2020-01-09 DIAGNOSIS — M9901 Segmental and somatic dysfunction of cervical region: Secondary | ICD-10-CM | POA: Diagnosis not present

## 2020-01-11 DIAGNOSIS — M9901 Segmental and somatic dysfunction of cervical region: Secondary | ICD-10-CM | POA: Diagnosis not present

## 2020-01-11 DIAGNOSIS — M545 Low back pain: Secondary | ICD-10-CM | POA: Diagnosis not present

## 2020-01-11 DIAGNOSIS — M5412 Radiculopathy, cervical region: Secondary | ICD-10-CM | POA: Diagnosis not present

## 2020-01-11 DIAGNOSIS — M9903 Segmental and somatic dysfunction of lumbar region: Secondary | ICD-10-CM | POA: Diagnosis not present

## 2020-01-11 DIAGNOSIS — M9902 Segmental and somatic dysfunction of thoracic region: Secondary | ICD-10-CM | POA: Diagnosis not present

## 2020-01-11 DIAGNOSIS — M9904 Segmental and somatic dysfunction of sacral region: Secondary | ICD-10-CM | POA: Diagnosis not present

## 2020-01-12 ENCOUNTER — Other Ambulatory Visit: Payer: Self-pay

## 2020-01-12 DIAGNOSIS — G8929 Other chronic pain: Secondary | ICD-10-CM

## 2020-01-13 MED ORDER — TRAMADOL HCL 50 MG PO TABS: 50.0000 mg | ORAL_TABLET | Freq: Four times a day (QID) | ORAL | 0 refills | Status: DC | PRN

## 2020-03-26 DIAGNOSIS — M9904 Segmental and somatic dysfunction of sacral region: Secondary | ICD-10-CM | POA: Diagnosis not present

## 2020-03-26 DIAGNOSIS — M5387 Other specified dorsopathies, lumbosacral region: Secondary | ICD-10-CM | POA: Diagnosis not present

## 2020-03-26 DIAGNOSIS — M9901 Segmental and somatic dysfunction of cervical region: Secondary | ICD-10-CM | POA: Diagnosis not present

## 2020-03-26 DIAGNOSIS — M4603 Spinal enthesopathy, cervicothoracic region: Secondary | ICD-10-CM | POA: Diagnosis not present

## 2020-03-26 DIAGNOSIS — M9902 Segmental and somatic dysfunction of thoracic region: Secondary | ICD-10-CM | POA: Diagnosis not present

## 2020-03-26 DIAGNOSIS — M9903 Segmental and somatic dysfunction of lumbar region: Secondary | ICD-10-CM | POA: Diagnosis not present

## 2020-04-05 DIAGNOSIS — J019 Acute sinusitis, unspecified: Secondary | ICD-10-CM | POA: Diagnosis not present

## 2020-04-05 DIAGNOSIS — R05 Cough: Secondary | ICD-10-CM | POA: Diagnosis not present

## 2020-04-05 DIAGNOSIS — B9689 Other specified bacterial agents as the cause of diseases classified elsewhere: Secondary | ICD-10-CM | POA: Diagnosis not present

## 2020-05-02 ENCOUNTER — Other Ambulatory Visit: Payer: Self-pay

## 2020-05-02 ENCOUNTER — Ambulatory Visit: Payer: Self-pay

## 2020-05-02 DIAGNOSIS — Z0000 Encounter for general adult medical examination without abnormal findings: Secondary | ICD-10-CM

## 2020-05-02 LAB — POCT URINALYSIS DIPSTICK
Bilirubin, UA: NEGATIVE
Blood, UA: NEGATIVE
Glucose, UA: NEGATIVE
Ketones, UA: NEGATIVE
Leukocytes, UA: NEGATIVE
Nitrite, UA: NEGATIVE
Protein, UA: NEGATIVE
Spec Grav, UA: 1.01 (ref 1.010–1.025)
Urobilinogen, UA: 0.2 E.U./dL
pH, UA: 6 (ref 5.0–8.0)

## 2020-05-03 LAB — CMP12+LP+TP+TSH+6AC+PSA+CBC…
ALT: 70 IU/L — ABNORMAL HIGH (ref 0–44)
AST: 43 IU/L — ABNORMAL HIGH (ref 0–40)
Albumin/Globulin Ratio: 2.2 (ref 1.2–2.2)
Albumin: 4.8 g/dL (ref 3.8–4.9)
Alkaline Phosphatase: 82 IU/L (ref 48–121)
BUN/Creatinine Ratio: 13 (ref 9–20)
BUN: 16 mg/dL (ref 6–24)
Basophils Absolute: 0 10*3/uL (ref 0.0–0.2)
Basos: 1 %
Bilirubin Total: 0.5 mg/dL (ref 0.0–1.2)
Calcium: 9.4 mg/dL (ref 8.7–10.2)
Chloride: 102 mmol/L (ref 96–106)
Chol/HDL Ratio: 4.3 ratio (ref 0.0–5.0)
Cholesterol, Total: 149 mg/dL (ref 100–199)
Creatinine, Ser: 1.19 mg/dL (ref 0.76–1.27)
EOS (ABSOLUTE): 0.4 10*3/uL (ref 0.0–0.4)
Eos: 5 %
Estimated CHD Risk: 0.8 times avg. (ref 0.0–1.0)
Free Thyroxine Index: 1.6 (ref 1.2–4.9)
GFR calc Af Amer: 81 mL/min/{1.73_m2} (ref >59)
GFR calc non Af Amer: 70 mL/min/{1.73_m2} (ref >59)
GGT: 65 IU/L (ref 0–65)
Globulin, Total: 2.2 g/dL (ref 1.5–4.5)
Glucose: 84 mg/dL (ref 65–99)
HDL: 35 mg/dL — ABNORMAL LOW (ref >39)
Hematocrit: 46.4 % (ref 37.5–51.0)
Hemoglobin: 14 g/dL (ref 13.0–17.7)
Immature Grans (Abs): 0 10*3/uL (ref 0.0–0.1)
Immature Granulocytes: 0 %
Iron: 31 ug/dL — ABNORMAL LOW (ref 38–169)
LDH: 199 IU/L (ref 121–224)
LDL Chol Calc (NIH): 90 mg/dL (ref 0–99)
Lymphocytes Absolute: 1.6 10*3/uL (ref 0.7–3.1)
Lymphs: 21 %
MCH: 22.3 pg — ABNORMAL LOW (ref 26.6–33.0)
MCHC: 30.2 g/dL — ABNORMAL LOW (ref 31.5–35.7)
MCV: 74 fL — ABNORMAL LOW (ref 79–97)
Monocytes Absolute: 0.8 10*3/uL (ref 0.1–0.9)
Monocytes: 11 %
Neutrophils Absolute: 4.8 10*3/uL (ref 1.4–7.0)
Neutrophils: 62 %
Phosphorus: 3 mg/dL (ref 2.8–4.1)
Platelets: 237 10*3/uL (ref 150–450)
Potassium: 4.3 mmol/L (ref 3.5–5.2)
Prostate Specific Ag, Serum: 2.5 ng/mL (ref 0.0–4.0)
RBC: 6.27 x10E6/uL — ABNORMAL HIGH (ref 4.14–5.80)
RDW: 18.6 % — ABNORMAL HIGH (ref 11.6–15.4)
Sodium: 137 mmol/L (ref 134–144)
T3 Uptake Ratio: 28 % (ref 24–39)
T4, Total: 5.6 ug/dL (ref 4.5–12.0)
TSH: 3.19 u[IU]/mL (ref 0.450–4.500)
Total Protein: 7 g/dL (ref 6.0–8.5)
Triglycerides: 135 mg/dL (ref 0–149)
Uric Acid: 4.2 mg/dL (ref 3.8–8.4)
VLDL Cholesterol Cal: 24 mg/dL (ref 5–40)
WBC: 7.7 10*3/uL (ref 3.4–10.8)

## 2020-05-13 ENCOUNTER — Ambulatory Visit: Payer: Self-pay | Admitting: Emergency Medicine

## 2020-05-13 ENCOUNTER — Other Ambulatory Visit: Payer: Self-pay

## 2020-05-13 VITALS — BP 138/96 | HR 95 | Temp 98.2°F | Resp 12 | Ht 70.0 in | Wt 232.0 lb

## 2020-05-13 DIAGNOSIS — F419 Anxiety disorder, unspecified: Secondary | ICD-10-CM

## 2020-05-13 DIAGNOSIS — Z0000 Encounter for general adult medical examination without abnormal findings: Secondary | ICD-10-CM

## 2020-05-13 DIAGNOSIS — G8929 Other chronic pain: Secondary | ICD-10-CM

## 2020-05-13 MED ORDER — TESTOSTERONE CYPIONATE 200 MG/ML IJ SOLN: 0.5000 mL | INTRAMUSCULAR | 3 refills | Status: DC

## 2020-05-13 MED ORDER — TRAMADOL HCL 50 MG PO TABS: 50.0000 mg | ORAL_TABLET | Freq: Four times a day (QID) | ORAL | 0 refills | Status: DC | PRN

## 2020-05-13 MED ORDER — ALPRAZOLAM 0.5 MG PO TABS: 0.5000 mg | ORAL_TABLET | Freq: Every day | ORAL | 0 refills | Status: DC | PRN

## 2020-05-13 NOTE — Progress Notes (Signed)
Occupational Health Provider Note       Time seen: 8:29 AM    I have reviewed the vital signs and the nursing notes.  HISTORY   Chief Complaint No chief complaint on file.   HPI Benjamin Carrillo is a 49 y.o. male with a history of coronary artery disease, MI, hyperlipidemia, IBS who presents today for annual physical examination.  Patient denies any recent illness or other complaints.  Past Medical History:  Diagnosis Date  . Anabolic steroid abuse [id number]  . CAD (coronary artery disease)    a. 09/08/15: inferior STEMI s/p DES of mid RCA  . Gastritis   . HLD (hyperlipidemia)   . 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 IBS (irritable bowel syndrome)   . Low HDL (under 40)   . Low testosterone   . MI (myocardial infarction) ([place])   . Pinched nerve    L4  . Polycythemia     Past Surgical History:  Procedure Laterality Date  . CARDIAC CATHETERIZATION N/A 09/08/2015   Procedure: Left Heart Cath and Coronary Angiography;  Surgeon: Peter M Martinique, MD;  Location: [facility];  Service: Cardiovascular;  Laterality: N/A;  . CARDIAC CATHETERIZATION N/A 09/08/2015   Procedure: Coronary Stent Intervention;  Surgeon: Peter M Martinique, MD;  Location: [place] [facility];  Service: Cardiovascular;  Laterality: N/A;  mid rca   . CORONARY ANGIOPLASTY WITH STENT PLACEMENT    . INGUINAL HERNIA REPAIR    . NM RENAL LASIX ([facility] HX) Bilateral 08/1997    Allergies Penicillins and Erythromycin   Review of Systems Constitutional: Negative for fever. Cardiovascular: Negative for chest pain. Respiratory: Negative for shortness of breath. Gastrointestinal: Negative for abdominal pain, vomiting and diarrhea. Musculoskeletal: Negative for back pain. Skin: Negative for rash. Neurological: Negative for headaches, focal weakness or numbness.  All systems negative/normal/unremarkable except as stated in the HPI  ____________________________________________   PHYSICAL  EXAM:  VITAL SIGNS: Vitals:   05/13/20 0838  BP: (!) 138/96  Pulse: 95  Resp: 12  Temp: 98.2 F (36.8 C)  SpO2: 96%    Constitutional: Alert and oriented. Well appearing and in no distress. Eyes: Conjunctivae are normal. Normal extraocular movements. ENT      Head: Normocephalic and atraumatic.      Nose: No congestion/rhinnorhea.      Mouth/Throat: Mucous membranes are moist.      Neck: No stridor. Cardiovascular: Normal rate, regular rhythm. No murmurs, rubs, or gallops. Respiratory: Normal respiratory effort without tachypnea nor retractions. Breath sounds are clear and equal bilaterally. No wheezes/rales/rhonchi. Gastrointestinal: Soft and nontender. Normal bowel sounds Musculoskeletal: Nontender with normal range of motion in extremities. No lower extremity tenderness nor edema. Neurologic:  Normal speech and language. No gross focal neurologic deficits are appreciated.  Skin:  Skin is warm, dry and intact. No rash noted. Psychiatric: Speech and behavior are normal.  ____________________________________________  EKG: Interpreted by me.  Sinus rhythm with a rate of 79 bpm, normal PR interval, normal QRS, normal QT  ____________________________________________   LABS (pertinent positives/negatives)  Recent Results (from the past 2160 hour(s))  CMP12+LP+TP+TSH+6AC+PSA+CBC.     Status: Abnormal   Collection Time: 05/02/20  8:16 AM  Result Value Ref Range   Glucose 84 65 - 99 mg/dL   Uric Acid 4.2 3.8 - 8.4 mg/dL    Comment:            Therapeutic target for gout patients: <6.0   BUN 16 6 - 24  mg/dL   Creatinine, Ser 1.19 0.76 - 1.27 mg/dL   GFR calc non Af Amer 70 >59 mL/min/1.73   GFR calc Af Amer 81 >59 mL/min/1.73    Comment: **[facility] currently reports eGFR in compliance with the current**   recommendations of the [employer]. [facility] will   update reporting as new guidelines are published from the NKF-ASN   Task force.    BUN/Creatinine Ratio 13  9 - 20   Sodium 137 134 - 144 mmol/L   Potassium 4.3 3.5 - 5.2 mmol/L   Chloride 102 96 - 106 mmol/L   Calcium 9.4 8.7 - 10.2 mg/dL   Phosphorus 3.0 2.8 - 4.1 mg/dL   Total Protein 7.0 6.0 - 8.5 g/dL   Albumin 4.8 3.8 - 4.9 g/dL   Globulin, Total 2.2 1.5 - 4.5 g/dL   Albumin/Globulin Ratio 2.2 1.2 - 2.2   Bilirubin Total 0.5 0.0 - 1.2 mg/dL   Alkaline Phosphatase 82 48 - 121 IU/L   LDH 199 121 - 224 IU/L   AST 43 (H) 0 - 40 IU/L   ALT 70 (H) 0 - 44 IU/L   GGT 65 0 - 65 IU/L   Iron 31 (L) 38 - 169 ug/dL   Cholesterol, Total 149 100 - 199 mg/dL   Triglycerides 135 0 - 149 mg/dL   HDL 35 (L) >39 mg/dL   VLDL Cholesterol Cal 24 5 - 40 mg/dL   LDL Chol Calc (NIH) 90 0 - 99 mg/dL   Chol/HDL Ratio 4.3 0.0 - 5.0 ratio    Comment:                                   T. Chol/HDL Ratio                                             Men  Women                               1/2 Avg.Risk  3.4    3.3                                   Avg.Risk  5.0    4.4                                2X Avg.Risk  9.6    7.1                                3X Avg.Risk 23.4   11.0    Estimated CHD Risk 0.8 0.0 - 1.0 times avg.    Comment: The CHD Risk is based on the T. Chol/HDL ratio. Other factors affect CHD Risk such as hypertension, smoking, diabetes, severe obesity, and family history of premature CHD.    TSH 3.190 0.450 - 4.500 uIU/mL   T4, Total 5.6 4.5 - 12.0 ug/dL   T3 Uptake Ratio 28 24 - 39 %   Free Thyroxine Index 1.6 1.2 - 4.9   Prostate Specific Ag, Serum 2.5 0.0 -  4.0 ng/mL    Comment: Roche ECLIA methodology. According to the American Urological Association, Serum PSA should decrease and remain at undetectable levels after radical prostatectomy. The AUA defines biochemical recurrence as an initial PSA value 0.2 ng/mL or greater followed by a subsequent confirmatory PSA value 0.2 ng/mL or greater. Values obtained with different assay methods or kits cannot be used interchangeably. Results  cannot be interpreted as absolute evidence of the presence or absence of malignant disease.    WBC 7.7 3.4 - 10.8 x10E3/uL   RBC 6.27 (H) 4.14 - 5.80 x10E6/uL   Hemoglobin 14.0 13.0 - 17.7 g/dL   Hematocrit 46.4 37.5 - 51.0 %   MCV 74 (L) 79 - 97 fL   MCH 22.3 (L) 26.6 - 33.0 pg   MCHC 30.2 (L) 31 - 35 g/dL   RDW 18.6 (H) 11.6 - 15.4 %   Platelets 237 150 - 450 x10E3/uL   Neutrophils 62 Not Estab. %   Lymphs 21 Not Estab. %   Monocytes 11 Not Estab. %   Eos 5 Not Estab. %   Basos 1 Not Estab. %   Neutrophils Absolute 4.8 1 - 7 x10E3/uL   Lymphocytes Absolute 1.6 0 - 3 x10E3/uL   Monocytes Absolute 0.8 0 - 0 x10E3/uL   EOS (ABSOLUTE) 0.4 0.0 - 0.4 x10E3/uL   Basophils Absolute 0.0 0 - 0 x10E3/uL   Immature Granulocytes 0 Not Estab. %   Immature Grans (Abs) 0.0 0.0 - 0.1 x10E3/uL  POCT urinalysis dipstick     Status: Normal   Collection Time: 05/02/20  8:43 AM  Result Value Ref Range   Color, UA yellow    Clarity, UA clear    Glucose, UA Negative Negative   Bilirubin, UA negative    Ketones, UA negative    Spec Grav, UA 1.010 1.010 - 1.025   Blood, UA negative    pH, UA 6.0 5.0 - 8.0   Protein, UA Negative Negative   Urobilinogen, UA 0.2 0.2 or 1.0 E.U./dL   Nitrite, UA negative    Leukocytes, UA Negative Negative   Appearance light    Odor       ASSESSMENT AND PLAN  Annual physical examination   Plan: The patient had presented for annual physical examination. Patient's labs do not reveal any acute process.  It appears medically clear for duty.  Lenise Arena MD    Note: This note was generated in part or whole with voice recognition software. Voice recognition is usually quite accurate but there are transcription errors that can and very often do occur. I apologize for any typographical errors that were not detected and corrected.

## 2020-05-14 DIAGNOSIS — M9907 Segmental and somatic dysfunction of upper extremity: Secondary | ICD-10-CM | POA: Diagnosis not present

## 2020-05-14 DIAGNOSIS — M50223 Other cervical disc displacement at C6-C7 level: Secondary | ICD-10-CM | POA: Diagnosis not present

## 2020-05-14 DIAGNOSIS — M531 Cervicobrachial syndrome: Secondary | ICD-10-CM | POA: Diagnosis not present

## 2020-05-14 DIAGNOSIS — M5127 Other intervertebral disc displacement, lumbosacral region: Secondary | ICD-10-CM | POA: Diagnosis not present

## 2020-06-28 ENCOUNTER — Other Ambulatory Visit: Payer: Self-pay | Admitting: Emergency Medicine

## 2020-06-28 DIAGNOSIS — G8929 Other chronic pain: Secondary | ICD-10-CM

## 2020-07-02 ENCOUNTER — Other Ambulatory Visit: Payer: Self-pay | Admitting: Physician Assistant

## 2020-07-02 DIAGNOSIS — E7849 Other hyperlipidemia: Secondary | ICD-10-CM

## 2020-09-03 ENCOUNTER — Other Ambulatory Visit: Payer: Self-pay | Admitting: Physician Assistant

## 2020-09-03 DIAGNOSIS — G8929 Other chronic pain: Secondary | ICD-10-CM

## 2020-10-09 ENCOUNTER — Telehealth: Payer: Self-pay

## 2020-10-09 NOTE — Telephone Encounter (Signed)
Overdue - COLONOSCOPY (Pts 45-33yrs) Need clinical update for use of tramadol

## 2020-10-09 NOTE — Telephone Encounter (Signed)
Rx refill request already in Epic - rerouted to Dr. Cecil Cobbs.  AMD

## 2020-10-10 ENCOUNTER — Other Ambulatory Visit: Payer: Self-pay

## 2020-10-10 DIAGNOSIS — M549 Dorsalgia, unspecified: Secondary | ICD-10-CM

## 2020-10-10 DIAGNOSIS — G8929 Other chronic pain: Secondary | ICD-10-CM

## 2020-10-15 MED ORDER — TRAMADOL HCL 50 MG PO TABS: 50.0000 mg | ORAL_TABLET | Freq: Four times a day (QID) | ORAL | 1 refills | Status: DC | PRN

## 2020-10-16 ENCOUNTER — Other Ambulatory Visit: Payer: Self-pay

## 2020-10-16 DIAGNOSIS — G8929 Other chronic pain: Secondary | ICD-10-CM

## 2020-10-16 MED ORDER — TRAMADOL HCL 50 MG PO TABS: 50.0000 mg | ORAL_TABLET | Freq: Four times a day (QID) | ORAL | 1 refills | Status: DC | PRN

## 2020-10-17 ENCOUNTER — Other Ambulatory Visit: Payer: Self-pay

## 2020-10-17 ENCOUNTER — Other Ambulatory Visit: Payer: Self-pay | Admitting: Physician Assistant

## 2020-10-17 DIAGNOSIS — G8929 Other chronic pain: Secondary | ICD-10-CM

## 2020-10-17 DIAGNOSIS — M549 Dorsalgia, unspecified: Secondary | ICD-10-CM

## 2020-10-17 MED ORDER — TRAMADOL HCL 50 MG PO TABS: 50.0000 mg | ORAL_TABLET | Freq: Four times a day (QID) | ORAL | 1 refills | Status: DC | PRN

## 2020-10-17 MED ORDER — TRAMADOL HCL 50 MG PO TABS: 50.0000 mg | ORAL_TABLET | Freq: Four times a day (QID) | ORAL | 0 refills | Status: DC | PRN

## 2020-11-27 ENCOUNTER — Other Ambulatory Visit: Payer: Self-pay | Admitting: Emergency Medicine

## 2020-11-27 DIAGNOSIS — E291 Testicular hypofunction: Secondary | ICD-10-CM

## 2020-11-27 DIAGNOSIS — R7989 Other specified abnormal findings of blood chemistry: Secondary | ICD-10-CM

## 2021-05-06 ENCOUNTER — Other Ambulatory Visit: Payer: Self-pay

## 2021-05-06 ENCOUNTER — Ambulatory Visit: Payer: Self-pay

## 2021-05-06 DIAGNOSIS — Z0000 Encounter for general adult medical examination without abnormal findings: Secondary | ICD-10-CM

## 2021-05-06 LAB — POCT URINALYSIS DIPSTICK
Bilirubin, UA: NEGATIVE
Blood, UA: NEGATIVE
Glucose, UA: NEGATIVE
Ketones, UA: NEGATIVE
Leukocytes, UA: NEGATIVE
Nitrite, UA: NEGATIVE
Protein, UA: NEGATIVE
Spec Grav, UA: 1.01 (ref 1.010–1.025)
Urobilinogen, UA: 0.2 E.U./dL
pH, UA: 6 (ref 5.0–8.0)

## 2021-05-06 NOTE — Progress Notes (Signed)
Annual physical scheduled 05/12/21

## 2021-05-07 LAB — CMP12+LP+TP+TSH+6AC+PSA+CBC…
ALT: 38 IU/L (ref 0–44)
AST: 26 IU/L (ref 0–40)
Albumin/Globulin Ratio: 2.6 — ABNORMAL HIGH (ref 1.2–2.2)
Albumin: 4.6 g/dL (ref 3.8–4.9)
Alkaline Phosphatase: 60 IU/L (ref 44–121)
BUN/Creatinine Ratio: 11 (ref 9–20)
BUN: 15 mg/dL (ref 6–24)
Basophils Absolute: 0.1 10*3/uL (ref 0.0–0.2)
Basos: 1 %
Bilirubin Total: 0.4 mg/dL (ref 0.0–1.2)
Calcium: 9.1 mg/dL (ref 8.7–10.2)
Chloride: 103 mmol/L (ref 96–106)
Chol/HDL Ratio: 6.1 ratio — ABNORMAL HIGH (ref 0.0–5.0)
Cholesterol, Total: 201 mg/dL — ABNORMAL HIGH (ref 100–199)
Creatinine, Ser: 1.33 mg/dL — ABNORMAL HIGH (ref 0.76–1.27)
EOS (ABSOLUTE): 0.6 10*3/uL — ABNORMAL HIGH (ref 0.0–0.4)
Eos: 9 %
Estimated CHD Risk: 1.3 times avg. — ABNORMAL HIGH (ref 0.0–1.0)
Free Thyroxine Index: 1.5 (ref 1.2–4.9)
GGT: 27 IU/L (ref 0–65)
Globulin, Total: 1.8 g/dL (ref 1.5–4.5)
Glucose: 87 mg/dL (ref 65–99)
HDL: 33 mg/dL — ABNORMAL LOW (ref >39)
Hematocrit: 46.5 % (ref 37.5–51.0)
Hemoglobin: 13.7 g/dL (ref 13.0–17.7)
Immature Grans (Abs): 0 10*3/uL (ref 0.0–0.1)
Immature Granulocytes: 0 %
Iron: 26 ug/dL — ABNORMAL LOW (ref 38–169)
LDH: 157 IU/L (ref 121–224)
LDL Chol Calc (NIH): 144 mg/dL — ABNORMAL HIGH (ref 0–99)
Lymphocytes Absolute: 1.2 10*3/uL (ref 0.7–3.1)
Lymphs: 17 %
MCH: 21.6 pg — ABNORMAL LOW (ref 26.6–33.0)
MCHC: 29.5 g/dL — ABNORMAL LOW (ref 31.5–35.7)
MCV: 73 fL — ABNORMAL LOW (ref 79–97)
Monocytes Absolute: 0.7 10*3/uL (ref 0.1–0.9)
Monocytes: 10 %
Neutrophils Absolute: 4.4 10*3/uL (ref 1.4–7.0)
Neutrophils: 63 %
Phosphorus: 3 mg/dL (ref 2.8–4.1)
Platelets: 294 10*3/uL (ref 150–450)
Potassium: 4.4 mmol/L (ref 3.5–5.2)
Prostate Specific Ag, Serum: 2.8 ng/mL (ref 0.0–4.0)
RBC: 6.35 x10E6/uL — ABNORMAL HIGH (ref 4.14–5.80)
RDW: 17.6 % — ABNORMAL HIGH (ref 11.6–15.4)
Sodium: 139 mmol/L (ref 134–144)
T3 Uptake Ratio: 26 % (ref 24–39)
T4, Total: 5.8 ug/dL (ref 4.5–12.0)
TSH: 2.2 u[IU]/mL (ref 0.450–4.500)
Total Protein: 6.4 g/dL (ref 6.0–8.5)
Triglycerides: 131 mg/dL (ref 0–149)
Uric Acid: 4 mg/dL (ref 3.8–8.4)
VLDL Cholesterol Cal: 24 mg/dL (ref 5–40)
WBC: 6.9 10*3/uL (ref 3.4–10.8)
eGFR: 64 mL/min/{1.73_m2} (ref >59)

## 2021-05-12 ENCOUNTER — Other Ambulatory Visit: Payer: Self-pay

## 2021-05-12 ENCOUNTER — Encounter: Payer: Self-pay | Admitting: Physician Assistant

## 2021-05-12 ENCOUNTER — Ambulatory Visit: Payer: Self-pay | Admitting: Physician Assistant

## 2021-05-12 VITALS — BP 128/98 | HR 99 | Temp 98.7°F | Resp 14 | Ht 70.0 in | Wt 227.0 lb

## 2021-05-12 DIAGNOSIS — F419 Anxiety disorder, unspecified: Secondary | ICD-10-CM

## 2021-05-12 DIAGNOSIS — R1031 Right lower quadrant pain: Secondary | ICD-10-CM

## 2021-05-12 DIAGNOSIS — Z0000 Encounter for general adult medical examination without abnormal findings: Secondary | ICD-10-CM

## 2021-05-12 MED ORDER — ALPRAZOLAM 0.5 MG PO TABS: 0.5000 mg | ORAL_TABLET | Freq: Every day | ORAL | 0 refills | Status: AC | PRN

## 2021-05-12 MED ORDER — TRAMADOL HCL 50 MG PO TABS: 50.0000 mg | ORAL_TABLET | Freq: Four times a day (QID) | ORAL | 0 refills | Status: DC | PRN

## 2021-05-12 NOTE — Progress Notes (Signed)
   Subjective: Annual physical exam    Patient ID: Benjamin Carrillo, male    DOB: [birth year], 50 y.o.   MRN: [id number]  HPI Patient presents annual physical exam.  Patient was concerned for increasing but intermitting right inguinal pain.  Patient states complaint is similar to his left inguinal hernia 12 years ago requiring surgery.   Review of Systems Anxiety and back pain    Objective:   Physical Exam No acute distress.  Temperature 98.7, pulse 99, respiration 14, BP is 128/90 and patient 97% O2 sat on room air.  Patient weighs 227 pounds and BMI is 32.6. HEENT is unremarkable.  Neck is supple without adenectomy or bruits.  Lungs are clear to auscultation.  Heart regular rate and rhythm.  EKG showed mild nonspecific QRS widening. Abdomen negative HSM, normoactive bowel sounds, soft, mild guarding palpation right inguinal area. No obvious deformity to the upper or lower extremities.  Patient has full and equal range of motion upper and lower extremities. No obvious cervical or lumbar spine deformity.  Patient had full and equal range of motion of the cervical lumbar spine. Cranial nerves II through XII are grossly intact.  DTR's 2+ without clonus.       Assessment & Plan: Well exam.   Discussed lab results with patient.  Advised to consult for ultrasound for further evaluation of right inguinal pain.  Follow-up after ultrasound.

## 2021-05-12 NOTE — Progress Notes (Signed)
Pt requesting medication refill on alprazolam and tramadol.

## 2021-05-13 ENCOUNTER — Other Ambulatory Visit: Payer: Self-pay | Admitting: Internal Medicine

## 2021-05-13 NOTE — Addendum Note (Signed)
Addended by: Aliene Altes on: 05/13/2021 02:58 PM   Modules accepted: Orders

## 2021-05-23 ENCOUNTER — Ambulatory Visit: Payer: 59

## 2021-05-27 ENCOUNTER — Other Ambulatory Visit: Payer: Self-pay | Admitting: Physician Assistant

## 2021-05-27 ENCOUNTER — Other Ambulatory Visit: Payer: Self-pay

## 2021-05-27 ENCOUNTER — Ambulatory Visit
Admission: RE | Admit: 2021-05-27 | Discharge: 2021-05-27 | Disposition: A | Discharge status: Home / Self Care | Payer: 59 | Source: Ambulatory Visit | Attending: Physician Assistant | Admitting: Physician Assistant

## 2021-05-27 DIAGNOSIS — R1031 Right lower quadrant pain: Secondary | ICD-10-CM | POA: Diagnosis not present

## 2021-05-27 DIAGNOSIS — R1032 Left lower quadrant pain: Secondary | ICD-10-CM

## 2021-06-25 ENCOUNTER — Other Ambulatory Visit: Payer: Self-pay | Admitting: Physician Assistant

## 2021-06-25 DIAGNOSIS — G8929 Other chronic pain: Secondary | ICD-10-CM

## 2021-06-25 DIAGNOSIS — M549 Dorsalgia, unspecified: Secondary | ICD-10-CM

## 2021-06-26 ENCOUNTER — Telehealth: Payer: Self-pay

## 2021-06-26 ENCOUNTER — Other Ambulatory Visit: Payer: Self-pay

## 2021-06-26 NOTE — Telephone Encounter (Signed)
Called Benjamin Carrillo back to see who he wants GI referral with.   He previously saw Dr. Gustavo Lah at [facility] & saw Sherrian Divers, MD at [facility]). States he would like to discuss with his wife & will call me back.  AMD

## 2021-06-26 NOTE — Telephone Encounter (Signed)
[facility] [facility] called clinic requesting Rx refill for Tramadol 50 mg.  When inputting Rx information into Epic, a box opened up indicating the patient's pharmacy had already sent an electronic Rx refill request to [facility] ED.  Re-routed Rx refill request to Randel Pigg, PA-C.  AMD

## 2021-08-13 ENCOUNTER — Other Ambulatory Visit: Payer: Self-pay | Admitting: Physician Assistant

## 2021-08-13 DIAGNOSIS — M549 Dorsalgia, unspecified: Secondary | ICD-10-CM

## 2021-08-13 DIAGNOSIS — G8929 Other chronic pain: Secondary | ICD-10-CM

## 2021-08-19 ENCOUNTER — Other Ambulatory Visit: Payer: Self-pay

## 2021-08-19 NOTE — Telephone Encounter (Signed)
[facility] Alt called clinic requesting Rx refill for Tramadol 50 mg.  Went to put info into [employer] a pended box opened up where a Rx refill request is already in [employer] - sent electronically by patient's pharmacy to [facility] ED.  Re-routed the pended Rx request for Tramadol 50 mg to Randel Pigg, PA-C.  AMD

## 2021-08-22 ENCOUNTER — Other Ambulatory Visit: Payer: Self-pay

## 2021-08-22 DIAGNOSIS — M549 Dorsalgia, unspecified: Secondary | ICD-10-CM

## 2021-08-22 DIAGNOSIS — G8929 Other chronic pain: Secondary | ICD-10-CM

## 2021-08-25 ENCOUNTER — Other Ambulatory Visit: Payer: Self-pay

## 2021-08-25 ENCOUNTER — Encounter: Payer: Self-pay | Admitting: Physician Assistant

## 2021-08-25 ENCOUNTER — Ambulatory Visit: Payer: Self-pay | Admitting: Physician Assistant

## 2021-08-25 VITALS — BP 147/95 | HR 85 | Temp 98.5°F | Resp 14 | Ht 70.0 in | Wt 228.0 lb

## 2021-08-25 DIAGNOSIS — G8929 Other chronic pain: Secondary | ICD-10-CM

## 2021-08-25 MED ORDER — TRAMADOL HCL 50 MG PO TABS: 50.0000 mg | ORAL_TABLET | Freq: Four times a day (QID) | ORAL | 0 refills | Status: AC | PRN

## 2021-08-25 NOTE — Progress Notes (Signed)
Pt presents today for medication refill. Pt states he was prescribed the medication for workers comp for lower back pain./CL,RMA

## 2021-08-25 NOTE — Progress Notes (Signed)
   Subjective: Chronic back pain    Patient ID: Benjamin Carrillo, male    DOB: 02-May-1971, 50 y.o.   MRN: [id number]  HPI Patient requests prescription refill for tramadol which he takes on an intermittent basis for chronic back pain.  Patient with diagnosis of HNP few years ago.  Patient refused surgery recommended by family doctor and orthopedics.  Patient stated has multiple sports equipment at home that he used to treat his back pain.  He states every once while he needs a mild pain medication especially after work.  Patient was initially on hydrocodone and was changed to tramadol by my predecessor.  Last prescription was in October 2022.  Review of Systems Chronic back pain    Objective:   Physical Exam No acute distress.  Temperature 98.5, pulse 80, respiration 14, BP is 147/95, patient 95% O2 sat on room air.  Patient weighs 220 pounds and BMI is 32.7. No obvious deformity to lumbar spine.  Patient has moderate guarding palpation from L4-S1.  Patient decreased range of motion with flexion today.  Patient negative straight leg test.       Assessment & Plan: Chronic back pain.     Prescription for tramadol was refilled.  Follow-up as needed.

## 2021-09-13 ENCOUNTER — Other Ambulatory Visit: Payer: Self-pay | Admitting: Physician Assistant

## 2021-09-13 DIAGNOSIS — R7989 Other specified abnormal findings of blood chemistry: Secondary | ICD-10-CM

## 2021-09-13 DIAGNOSIS — E291 Testicular hypofunction: Secondary | ICD-10-CM

## 2021-09-17 ENCOUNTER — Other Ambulatory Visit: Payer: Self-pay

## 2021-09-17 NOTE — Telephone Encounter (Signed)
Received fax from [facility] for Request for New Rx for Testosterone Cypionate 200 mg/ml - inject 0.5 ml IM q 14 days.  When I went to input the Rx refill request, there was already a pended Rx refill request in Epic sent in electronically by pharmacy to [facility].  Re-routed the pended Rx refill request to Randel Pigg, PA-C.  AMD

## 2022-04-14 ENCOUNTER — Ambulatory Visit: Payer: Self-pay

## 2022-04-14 DIAGNOSIS — Z0000 Encounter for general adult medical examination without abnormal findings: Secondary | ICD-10-CM

## 2022-04-14 LAB — POCT URINALYSIS DIPSTICK
Bilirubin, UA: NEGATIVE
Blood, UA: NEGATIVE
Glucose, UA: NEGATIVE
Ketones, UA: NEGATIVE
Leukocytes, UA: NEGATIVE
Nitrite, UA: NEGATIVE
Protein, UA: NEGATIVE
Spec Grav, UA: 1.015 (ref 1.010–1.025)
Urobilinogen, UA: 0.2 E.U./dL
pH, UA: 6 (ref 5.0–8.0)

## 2022-04-14 NOTE — Progress Notes (Signed)
Annual physical labs, urine and EKG obtained

## 2022-04-15 LAB — CMP12+LP+TP+TSH+6AC+PSA+CBC…
ALT: 37 [IU]/L (ref 0–44)
AST: 30 [IU]/L (ref 0–40)
Albumin/Globulin Ratio: 2 (ref 1.2–2.2)
Albumin: 4.4 g/dL (ref 3.8–4.9)
Alkaline Phosphatase: 65 [IU]/L (ref 44–121)
BUN/Creatinine Ratio: 14 (ref 9–20)
BUN: 18 mg/dL (ref 6–24)
Basophils Absolute: 0 10*3/uL (ref 0.0–0.2)
Basos: 1 %
Bilirubin Total: 0.6 mg/dL (ref 0.0–1.2)
Calcium: 9.5 mg/dL (ref 8.7–10.2)
Chloride: 102 mmol/L (ref 96–106)
Chol/HDL Ratio: 5.7 ratio — ABNORMAL HIGH (ref 0.0–5.0)
Cholesterol, Total: 166 mg/dL (ref 100–199)
Creatinine, Ser: 1.31 mg/dL — ABNORMAL HIGH (ref 0.76–1.27)
EOS (ABSOLUTE): 0.6 10*3/uL — ABNORMAL HIGH (ref 0.0–0.4)
Eos: 8 %
Estimated CHD Risk: 1.2 times avg. — ABNORMAL HIGH (ref 0.0–1.0)
Free Thyroxine Index: 1.7 (ref 1.2–4.9)
GGT: 20 [IU]/L (ref 0–65)
Globulin, Total: 2.2 g/dL (ref 1.5–4.5)
Glucose: 94 mg/dL (ref 70–99)
HDL: 29 mg/dL — ABNORMAL LOW
Hematocrit: 45.3 % (ref 37.5–51.0)
Hemoglobin: 13.1 g/dL (ref 13.0–17.7)
Immature Grans (Abs): 0 10*3/uL (ref 0.0–0.1)
Immature Granulocytes: 0 %
Iron: 22 ug/dL — ABNORMAL LOW (ref 38–169)
LDH: 146 [IU]/L (ref 121–224)
LDL Chol Calc (NIH): 114 mg/dL — ABNORMAL HIGH (ref 0–99)
Lymphocytes Absolute: 1 10*3/uL (ref 0.7–3.1)
Lymphs: 14 %
MCH: 20.8 pg — ABNORMAL LOW (ref 26.6–33.0)
MCHC: 28.9 g/dL — ABNORMAL LOW (ref 31.5–35.7)
MCV: 72 fL — ABNORMAL LOW (ref 79–97)
Monocytes Absolute: 0.8 10*3/uL (ref 0.1–0.9)
Monocytes: 11 %
Neutrophils Absolute: 5 10*3/uL (ref 1.4–7.0)
Neutrophils: 66 %
Phosphorus: 3.4 mg/dL (ref 2.8–4.1)
Platelets: 277 10*3/uL (ref 150–450)
Potassium: 4.5 mmol/L (ref 3.5–5.2)
Prostate Specific Ag, Serum: 3 ng/mL (ref 0.0–4.0)
RBC: 6.31 x10E6/uL — ABNORMAL HIGH (ref 4.14–5.80)
RDW: 17.1 % — ABNORMAL HIGH (ref 11.6–15.4)
Sodium: 138 mmol/L (ref 134–144)
T3 Uptake Ratio: 29 % (ref 24–39)
T4, Total: 5.7 ug/dL (ref 4.5–12.0)
TSH: 1.84 u[IU]/mL (ref 0.450–4.500)
Total Protein: 6.6 g/dL (ref 6.0–8.5)
Triglycerides: 128 mg/dL (ref 0–149)
Uric Acid: 3.4 mg/dL — ABNORMAL LOW (ref 3.8–8.4)
VLDL Cholesterol Cal: 23 mg/dL (ref 5–40)
WBC: 7.4 10*3/uL (ref 3.4–10.8)
eGFR: 65 mL/min/{1.73_m2}

## 2022-04-16 ENCOUNTER — Ambulatory Visit: Payer: Self-pay | Admitting: Physician Assistant

## 2022-04-16 ENCOUNTER — Encounter: Payer: Self-pay | Admitting: Physician Assistant

## 2022-04-16 VITALS — BP 128/83 | HR 95 | Temp 98.9°F | Resp 14 | Ht 70.0 in | Wt 216.0 lb

## 2022-04-16 DIAGNOSIS — Z0000 Encounter for general adult medical examination without abnormal findings: Secondary | ICD-10-CM

## 2022-04-16 NOTE — Progress Notes (Signed)
Pt presents today to complete physical. Pt denies any issues or concerns at this time./CL,RMA 

## 2022-04-16 NOTE — Progress Notes (Signed)
[facility]   ____________________________________________   None    (approximate)  I have reviewed the triage vital signs and the nursing notes.   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  Patient voices no concerns or complaints.         Past Medical History:  Diagnosis Date   Anabolic steroid abuse [id number]   CAD (coronary artery disease)    a. 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   IBS (irritable bowel syndrome)    Low HDL (under 40)    Low testosterone    MI (myocardial infarction) ([place])    Pinched nerve    L4   Polycythemia     Patient Active Problem List   Diagnosis Date Noted   Lumbar spinal stenosis 11/30/2019   Anxiety state 05/25/2019   Hypogonadism in male 05/25/2019   CAD (coronary artery disease)    Other hyperlipidemia    ST elevation (STEMI) myocardial infarction involving right coronary artery ([place]) 09/08/2015    Past Surgical History:  Procedure Laterality Date   CARDIAC CATHETERIZATION N/A 09/08/2015   Procedure: Left Heart Cath and Coronary Angiography;  Surgeon: Peter M Martinique, MD;  Location: [facility];  Service: Cardiovascular;  Laterality: N/A;   CARDIAC CATHETERIZATION N/A 09/08/2015   Procedure: Coronary Stent Intervention;  Surgeon: Peter M Martinique, MD;  Location: [facility];  Service: Cardiovascular;  Laterality: N/A;  mid rca    CORONARY ANGIOPLASTY WITH STENT PLACEMENT     INGUINAL HERNIA REPAIR     NM RENAL LASIX ([place] HX) Bilateral 08/1997    Prior to Admission medications   Medication Sig Start Date End Date Taking? Authorizing Provider  ALPRAZolam Duanne Moron) 0.5 MG tablet Take 1 tablet (0.5 mg total) by mouth daily as needed. Use sparingly. 05/12/21  Yes Sable Feil, PA-C  aspirin 81 MG  chewable tablet Chew 1 tablet (81 mg total) by mouth daily. 09/10/15  Yes Eileen Stanford, PA-C  Glucosamine HCl (GLUCOSAMINE PO) Take by mouth.   Yes [provider]  Nutritional Supplements (DHEA PO) Take 1 tablet by mouth daily.   Yes [provider]  QUERCETIN PO Take by mouth.   Yes [provider]  testosterone cypionate (DEPOTESTOSTERONE CYPIONATE) 200 MG/ML injection INJECT 0.5 ML EVERY 14 DAYS 09/17/21  Yes Sable Feil, PA-C  traMADol (ULTRAM) 50 MG tablet Take 1 tablet (50 mg total) by mouth every 6 (six) hours as needed. 08/25/21  Yes Sable Feil, PA-C    Allergies Penicillins and Erythromycin  Family History  Problem Relation Age of Onset   CAD Maternal Grandfather    CAD Paternal Grandfather    COPD Mother    Stroke Father    Heart disease Sister     Social History Social History   Tobacco Use   Smoking status: Never   Smokeless tobacco: Never  Substance Use Topics   Alcohol use: Yes    Alcohol/week: 0.0 standard drinks of alcohol    Comment: Rare   Drug use: No    Review of Systems Constitutional: No fever/chills Eyes: No visual changes. ENT: No sore throat. Cardiovascular: Denies chest pain.  CAD. Respiratory: Denies shortness of breath. Gastrointestinal: No abdominal pain.  No nausea, no  vomiting.  No diarrhea.  No constipation. Genitourinary: Negative for dysuria. Musculoskeletal: Negative for back pain. Skin: Negative for rash. Neurological: Negative for headaches, focal weakness or numbness. {**Psychiatric: Anxiety Endocrine: Hyperlipidemia and hypogonadism Hematological/Lymphatic:  Allergic/Immunilogical: Penicillin and erythromycin  ____________________________________________   PHYSICAL EXAM:  VITAL SIGNS: BP is 128/83, pulse 95, respiration 14, temperature 98.9, and patient 95% O2 sat on room air.  Patient weighs 216 pounds and BMI is 30.99. Constitutional: Alert and oriented. Well appearing and in no  acute distress. Eyes: Conjunctivae are normal. PERRL. EOMI. Head: Atraumatic. Nose: No congestion/rhinnorhea. Mouth/Throat: Mucous membranes are moist.  Oropharynx non-erythematous. Neck: No stridor.  No cervical spine tenderness to palpation. Hematological/Lymphatic/Immunilogical: No cervical lymphadenopathy. Cardiovascular: Normal rate, regular rhythm. Grossly normal heart sounds.  Good peripheral circulation. Respiratory: Normal respiratory effort.  No retractions. Lungs CTAB. Gastrointestinal: Soft and nontender. No distention. No abdominal bruits. No CVA tenderness. Genitourinary: Deferred Musculoskeletal: No lower extremity tenderness nor edema.  No joint effusions. Neurologic:  Normal speech and language. No gross focal neurologic deficits are appreciated. No gait instability. Skin:  Skin is warm, dry and intact. No rash noted. Psychiatric: Mood and affect are normal. Speech and behavior are normal.  ____________________________________________   LABS _____        Component Ref Range & Units 2 d ago 11 mo ago 1 yr ago 2 yr ago  Color, UA  yellow  Light Yellow  yellow  Pale Yellow   Clarity, UA  clear  Clear  clear  Clear   Glucose, UA Negative Negative  Negative  Negative  Negative   Bilirubin, UA  neg  Negative  negative  Negative   Ketones, UA  neg  Negative  negative  Negative   Spec Grav, UA 1.010 - 1.025 1.015  1.010  1.010  1.010   Blood, UA  neg  Negative  negative  Negative   pH, UA 5.0 - 8.0 6.0  6.0  6.0  6.0   Protein, UA Negative Negative  Negative  Negative  Negative   Urobilinogen, UA 0.2 or 1.0 E.U./dL 0.2  0.2  0.2  0.2   Nitrite, UA  neg  Negative  negative  Negative   Leukocytes, UA Negative Negative  Negative  Negative  Negative   Appearance     light     Odor                          Other Results from 04/14/2022   Contains abnormal data Male Executive Panel Order: [id number] Status: Final result    Visible to patient: Yes (not seen)    Next  appt: None    Dx: Annual physical exam    0 Result Notes           Component Ref Range & Units 2 d ago (04/14/22) 11 mo ago (05/06/21) 1 yr ago (05/02/20) 2 yr ago (08/17/19) 2 yr ago (05/17/19) 3 yr ago (03/24/19) 3 yr ago (03/06/19)  Glucose 70 - 99 mg/dL 94  87 R  84 R   89 R     Uric Acid 3.8 - 8.4 mg/dL 3.4 Low   4.0 CM  4.2 CM   3.7 R, CM     Comment:            Therapeutic target for gout patients: <6.0  BUN 6 - 24 mg/dL '18  15  16   15     ' Creatinine, Ser 0.76 -  1.27 mg/dL 1.31 High   1.33 High   1.19   1.29 High      eGFR >59 mL/min/1.73 65  64        BUN/Creatinine Ratio 9 - '20 14  11  13   12     ' Sodium 134 - 144 mmol/L 138  139  137   139     Potassium 3.5 - 5.2 mmol/L 4.5  4.4  4.3   4.6     Chloride 96 - 106 mmol/L 102  103  102   103     Calcium 8.7 - 10.2 mg/dL 9.5  9.1  9.4   9.5     Phosphorus 2.8 - 4.1 mg/dL 3.4  3.0  3.0   3.0     Total Protein 6.0 - 8.5 g/dL 6.6  6.4  7.0   6.8     Albumin 3.8 - 4.9 g/dL 4.4  4.6  4.8   4.5 R     Globulin, Total 1.5 - 4.5 g/dL 2.2  1.8  2.2   2.3     Albumin/Globulin Ratio 1.2 - 2.2 2.0  2.6 High   2.2   2.0     Bilirubin Total 0.0 - 1.2 mg/dL 0.6  0.4  0.5   0.4     Alkaline Phosphatase 44 - 121 IU/L 65  60  82 R   63 R     LDH 121 - 224 IU/L 146  157  199   176     AST 0 - 40 IU/L 30  26  43 High    28     ALT 0 - 44 IU/L 37  38  70 High    33     GGT 0 - 65 IU/L 20  27  65   28     Iron 38 - 169 ug/dL 22 Low   26 Low   31 Low    26 Low      Cholesterol, Total 100 - 199 mg/dL 166  201 High   149   202 High      Triglycerides 0 - 149 mg/dL 128  131  135   100     HDL >39 mg/dL 29 Low   33 Low   35 Low    32 Low      VLDL Cholesterol Cal 5 - 40 mg/dL '23  24  24   18     ' LDL Chol Calc (NIH) 0 - 99 mg/dL 114 High   144 High   90   152 High      Chol/HDL Ratio 0.0 - 5.0 ratio 5.7 High   6.1 High  CM  4.3 CM   6.3 High  CM     Comment:                                   T. Chol/HDL Ratio                                               Men  Women                                1/2 Avg.Risk  3.4  3.3                                    Avg.Risk  5.0    4.4                                 2X Avg.Risk  9.6    7.1                                 3X Avg.Risk 23.4   11.0   Estimated CHD Risk 0.0 - 1.0 times avg. 1.2 High   1.3 High  CM  0.8 CM   1.3 High  CM     Comment: The CHD Risk is based on the T. Chol/HDL ratio. Other  factors affect CHD Risk such as hypertension, smoking,  diabetes, severe obesity, and family history of  premature CHD.   TSH 0.450 - 4.500 uIU/mL 1.840  2.200  3.190   2.110     T4, Total 4.5 - 12.0 ug/dL 5.7  5.8  5.6   4.8     T3 Uptake Ratio 24 - 39 % '29  26  28   27     ' Free Thyroxine Index 1.2 - 4.9 1.7  1.5  1.6   1.3     Prostate Specific Ag, Serum 0.0 - 4.0 ng/mL 3.0  2.8 CM  2.5 CM   1.9 CM     Comment: Roche ECLIA methodology.  According to the American Urological Association, Serum PSA should  decrease and remain at undetectable levels after radical  prostatectomy. The AUA defines biochemical recurrence as an initial  PSA value 0.2 ng/mL or greater followed by a subsequent confirmatory  PSA value 0.2 ng/mL or greater.  Values obtained with different assay methods or kits cannot be used  interchangeably. Results cannot be interpreted as absolute evidence  of the presence or absence of malignant disease.   WBC 3.4 - 10.8 x10E3/uL 7.4  6.9  7.7  5.3  6.3  8.0  7.2   RBC 4.14 - 5.80 x10E6/uL 6.31 High   6.35 High   6.27 High   6.03 High   6.12 High   5.81 High   6.64 High    Hemoglobin 13.0 - 17.7 g/dL 13.1  13.7  14.0  13.8  14.5  14.3  16.1   Hematocrit 37.5 - 51.0 % 45.3  46.5  46.4  45.7  46.6  44.8  54.0 High    MCV 79 - 97 fL 72 Low   73 Low   74 Low   76 Low   76 Low   77 Low   81   MCH 26.6 - 33.0 pg 20.8 Low   21.6 Low   22.3 Low   22.9 Low   23.7 Low   24.6 Low   24.2 Low    MCHC 31.5 - 35.7 g/dL 28.9 Low   29.5 Low   30.2 Low   30.2 Low   31.1 Low   31.9  29.8 Low    RDW 11.6 - 15.4  % 17.1 High   17.6 High   18.6 High   14.6  14.8  14.5  17.1 High    Platelets 150 - 450 x10E3/uL 277  294  237  265  313  269  247   Neutrophils Not Estab. % 66  63  62  61  58  62  64   Lymphs Not Estab. % '14  17  21  21  20  18  18   ' Monocytes Not Estab. % '11  10  11  9  11  11  10   ' Eos Not Estab. % '8  9  5  8  10  8  7   ' Basos Not Estab. % '1  1  1  1  1  1  1   ' Neutrophils Absolute 1.4 - 7.0 x10E3/uL 5.0  4.4  4.8  3.2  3.6  5.0  4.6   Lymphocytes Absolute 0.7 - 3.1 x10E3/uL 1.0  1.2  1.6  1.1  1.3  1.4  1.3   Monocytes Absolute 0.1 - 0.9 x10E3/uL 0.8  0.7  0.8  0.5  0.7  0.9  0.7   EOS (ABSOLUTE) 0.0 - 0.4 x10E3/uL 0.6 High   0.6 High   0.4  0.4  0.6 High   0.6 High   0.5 High    Basophils Absolute 0.0 - 0.2 x10E3/uL 0.0  0.1  0.0  0.1  0.1  0.1  0.1   Immature Granulocytes Not Estab. % 0  0  0  0  0  0  0   Immature Grans             _______________________________________  EKG Sinus  Rhythm at 69 bpm WITHIN NORMAL LIMITS  ____________________________________________    ____________________________________________   INITIAL IMPRESSION / ASSESSMENT AND PLAN  As part of my medical decision making, I reviewed the following data within the electronic MEDICAL RECORD NUMBER      Discussed no acute findings on lab and EKG results.       ____________________________________________   FINAL CLINICAL IMPRESSION Well exam   ED Discharge Orders     None        Note:  This document was prepared using Dragon voice recognition software and may include unintentional dictation errors.

## 2022-06-05 IMAGING — US US ABDOMEN LIMITED
1 series · 14 of 24 positions shown · non-contrast
Comparison: CT 07/14/2010

CLINICAL DATA: Right lower quadrant pain intermittently.

EXAM:
ULTRASOUND ABDOMEN LIMITED

[Series 1: us abdomen limited · 0.22mm/px · 14 of 24 slices shown]
[im 1/24]
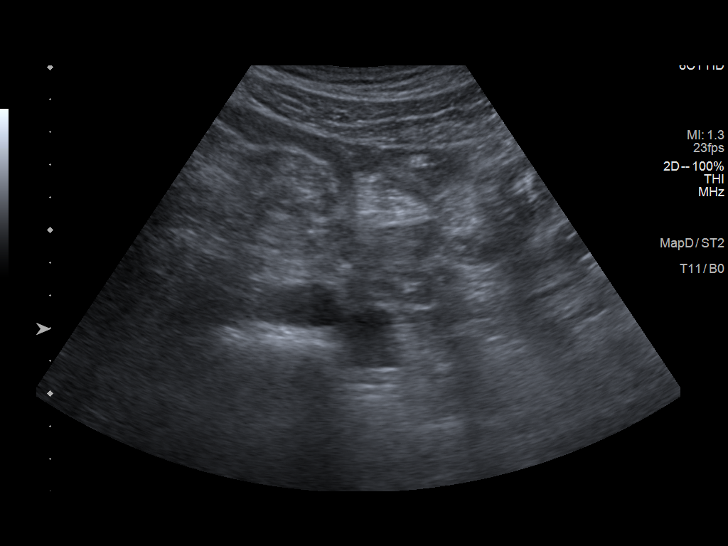
[im 3/24]
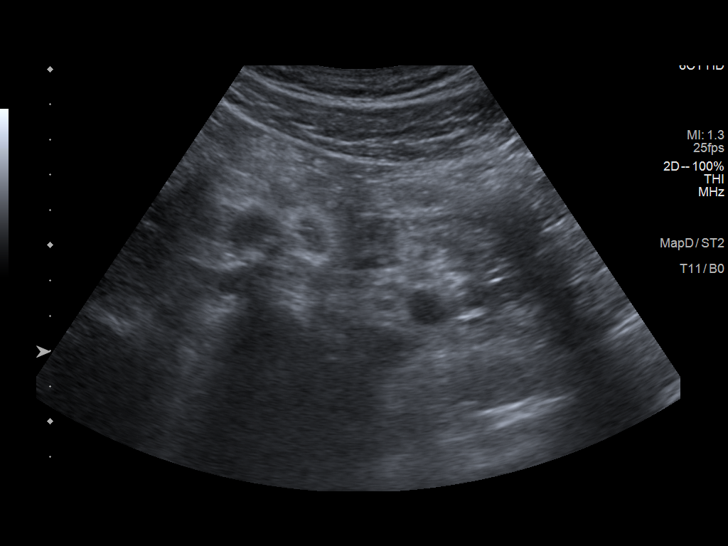
[im 5/24]
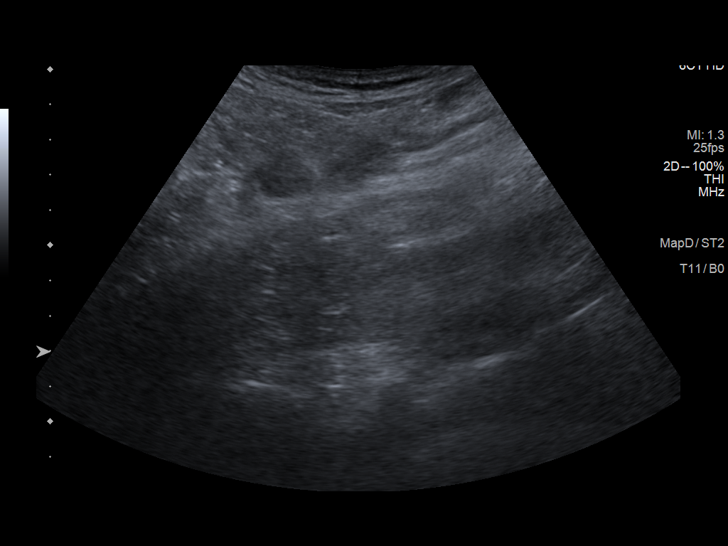
[im 7/24]
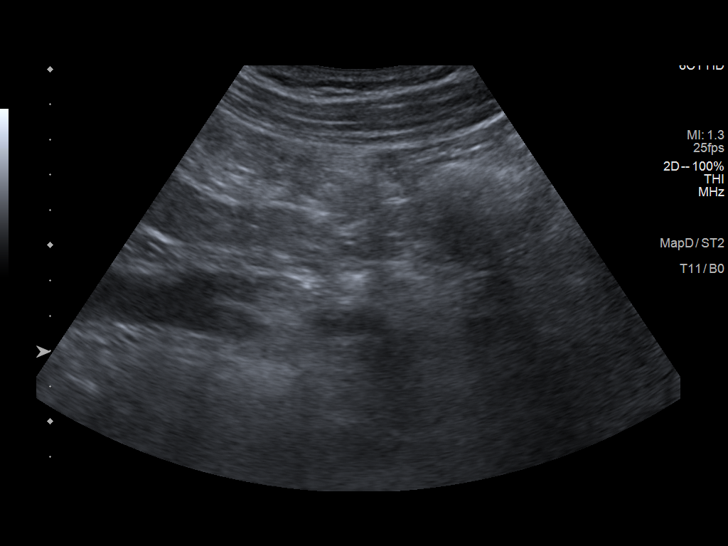
[im 8/24]
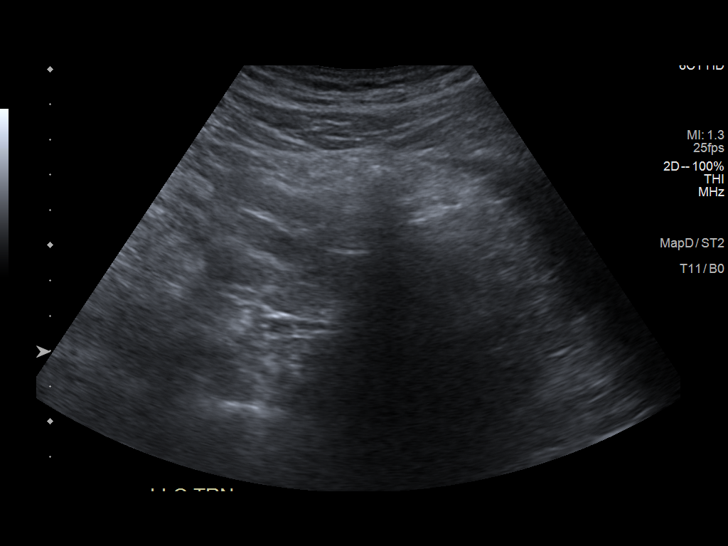
[im 10/24]
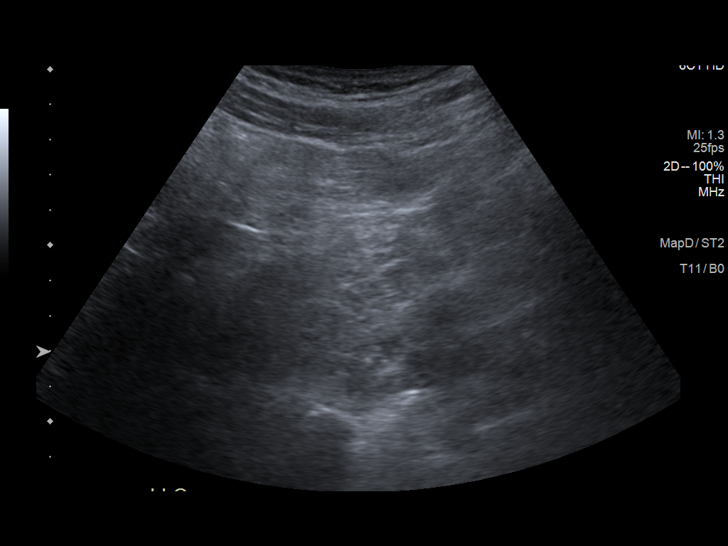
[im 12/24]
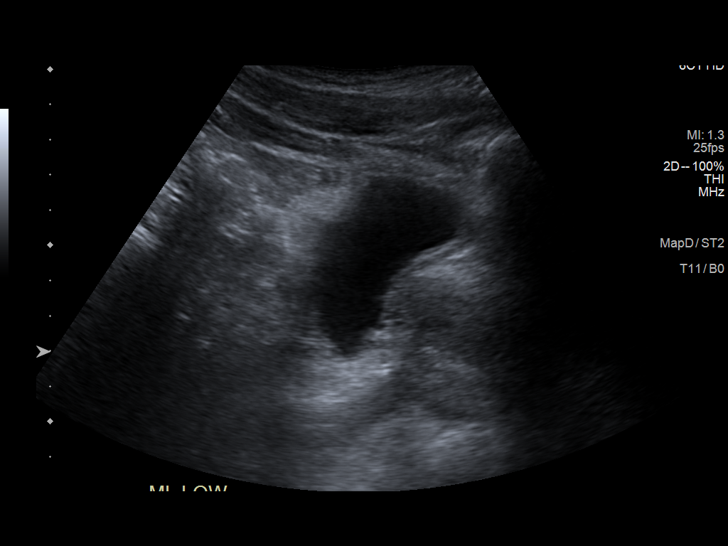
[im 13/24]
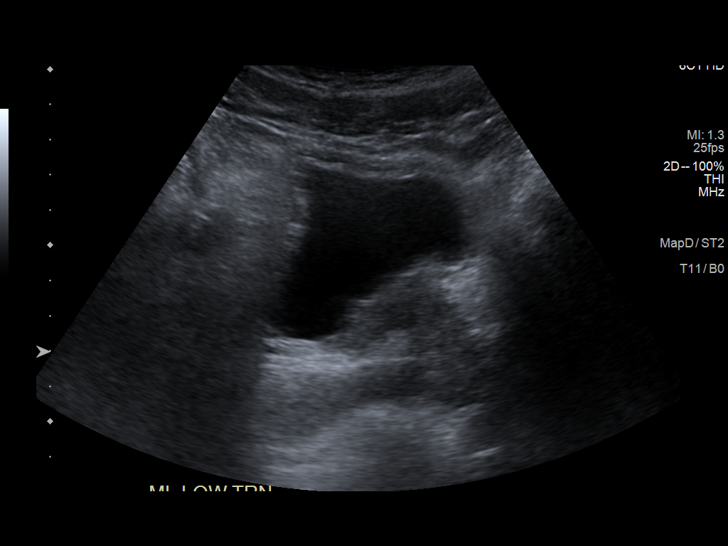
[im 15/24]
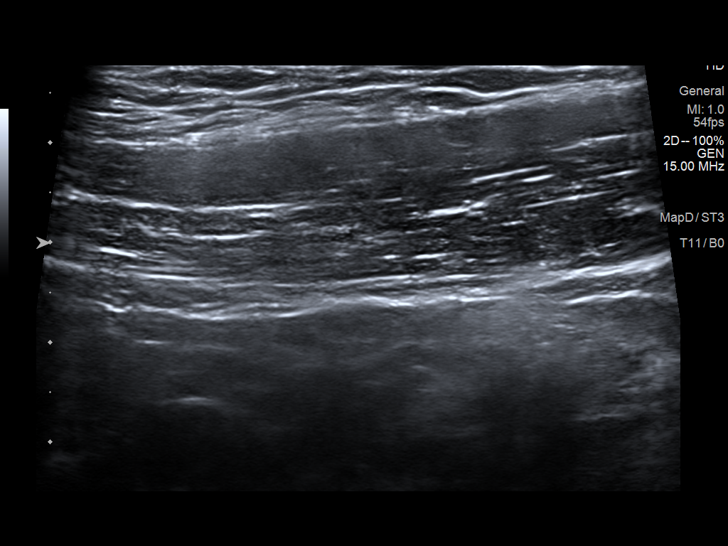
[im 17/24]
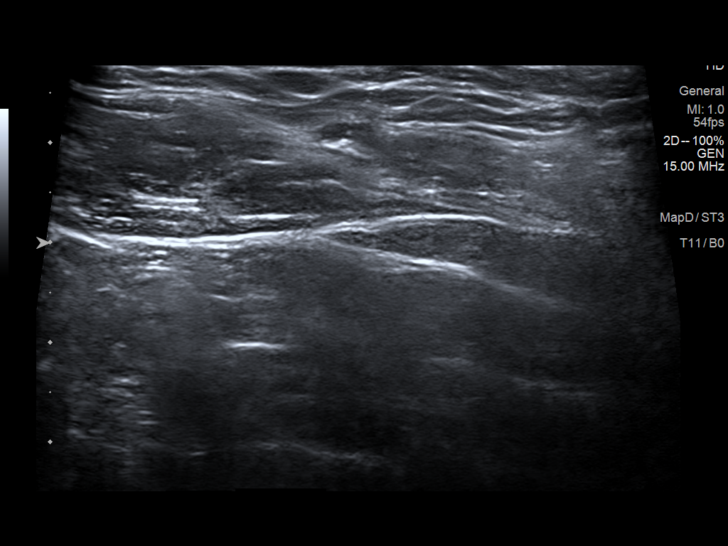
[im 19/24]
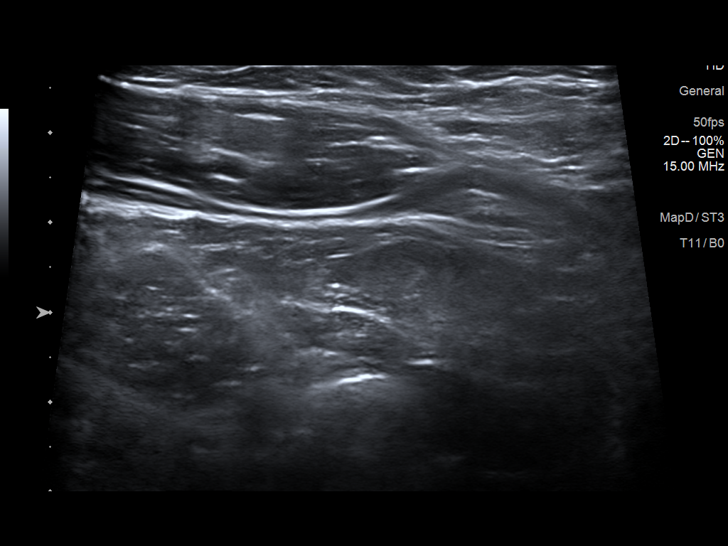
[im 20/24]
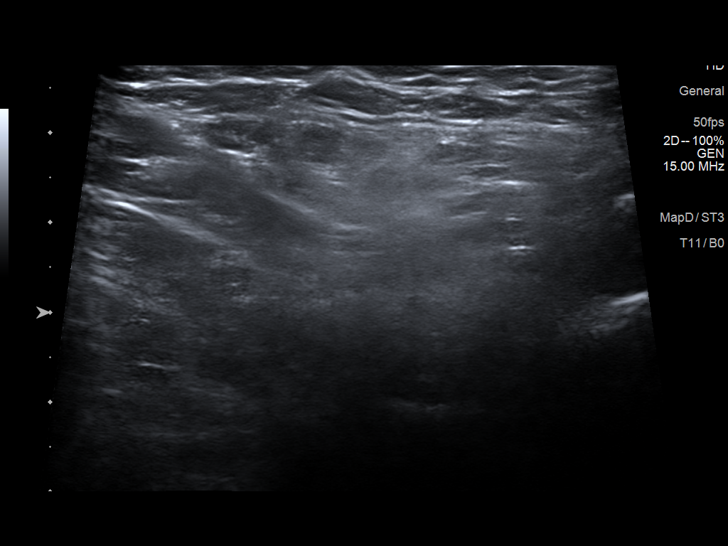
[im 22/24]
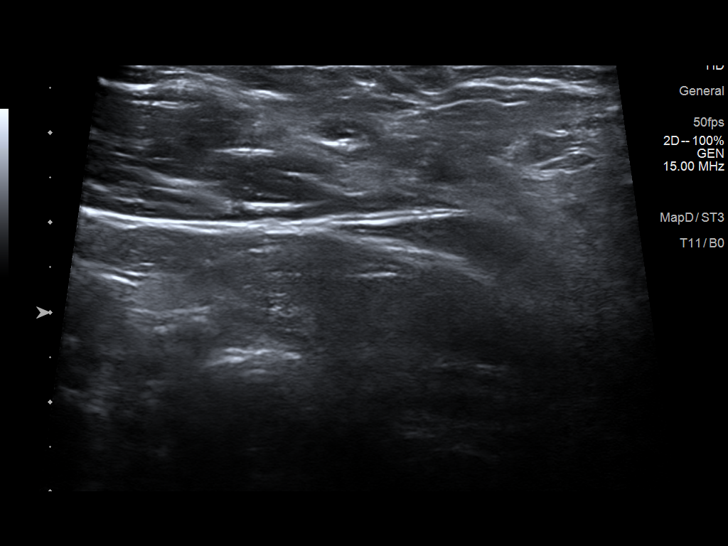
[im 24/24]
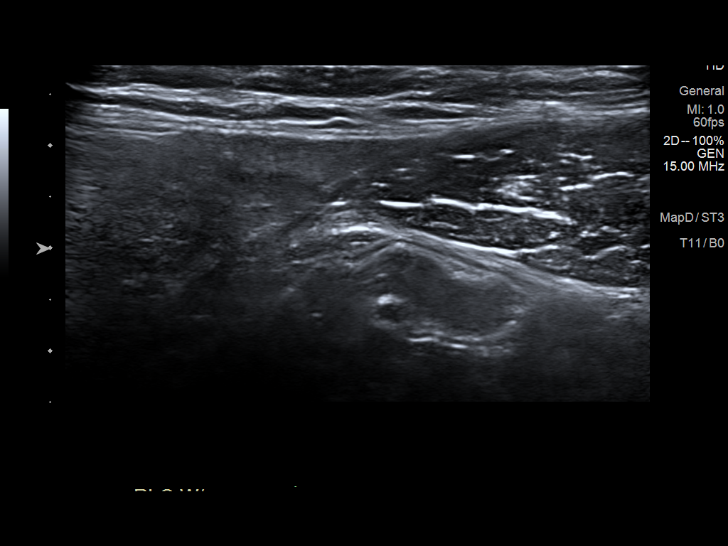

[14 of 24 positions shown; findings below may reference images not displayed]

FINDINGS: Multiplanar grayscale ultrasound of the abdomen and right groin
demonstrate no mass or hernia.
IMPRESSION: No sonographic evidence of mass or explanation for patient's
symptoms. If symptoms warrant, consider CT.

## 2023-05-06 ENCOUNTER — Other Ambulatory Visit: Payer: Self-pay

## 2023-05-06 DIAGNOSIS — Z0000 Encounter for general adult medical examination without abnormal findings: Secondary | ICD-10-CM

## 2023-05-06 LAB — POCT URINALYSIS DIPSTICK
Bilirubin, UA: NEGATIVE
Blood, UA: NEGATIVE
Glucose, UA: NEGATIVE
Ketones, UA: NEGATIVE
Leukocytes, UA: NEGATIVE
Nitrite, UA: NEGATIVE
Protein, UA: NEGATIVE
Spec Grav, UA: 1.01 (ref 1.010–1.025)
Urobilinogen, UA: 0.2 E.U./dL
pH, UA: 6 (ref 5.0–8.0)

## 2023-05-06 NOTE — Progress Notes (Signed)
Pt complete labs for scheduled physical.

## 2023-05-07 LAB — CMP12+LP+TP+TSH+6AC+PSA+CBC…
ALT: 78 IU/L — ABNORMAL HIGH (ref 0–44)
AST: 36 IU/L (ref 0–40)
Albumin: 4.5 g/dL (ref 3.8–4.9)
Alkaline Phosphatase: 50 IU/L (ref 44–121)
BUN/Creatinine Ratio: 18 (ref 9–20)
BUN: 23 mg/dL (ref 6–24)
Basophils Absolute: 0.1 10*3/uL (ref 0.0–0.2)
Basos: 1 %
Bilirubin Total: 0.3 mg/dL (ref 0.0–1.2)
Calcium: 9.1 mg/dL (ref 8.7–10.2)
Chloride: 103 mmol/L (ref 96–106)
Chol/HDL Ratio: 6.8 ratio — ABNORMAL HIGH (ref 0.0–5.0)
Cholesterol, Total: 197 mg/dL (ref 100–199)
Creatinine, Ser: 1.29 mg/dL — ABNORMAL HIGH (ref 0.76–1.27)
EOS (ABSOLUTE): 0.4 10*3/uL (ref 0.0–0.4)
Eos: 6 %
Estimated CHD Risk: 1.4 times avg. — ABNORMAL HIGH (ref 0.0–1.0)
Free Thyroxine Index: 1.9 (ref 1.2–4.9)
GGT: 31 IU/L (ref 0–65)
Globulin, Total: 1.9 g/dL (ref 1.5–4.5)
Glucose: 83 mg/dL (ref 70–99)
HDL: 29 mg/dL — ABNORMAL LOW (ref >39)
Hematocrit: 52.7 % — ABNORMAL HIGH (ref 37.5–51.0)
Hemoglobin: 16.1 g/dL (ref 13.0–17.7)
Immature Grans (Abs): 0 10*3/uL (ref 0.0–0.1)
Immature Granulocytes: 0 %
Iron: 28 ug/dL — ABNORMAL LOW (ref 38–169)
LDH: 154 IU/L (ref 121–224)
LDL Chol Calc (NIH): 149 mg/dL — ABNORMAL HIGH (ref 0–99)
Lymphocytes Absolute: 1 10*3/uL (ref 0.7–3.1)
Lymphs: 16 %
MCH: 24.1 pg — ABNORMAL LOW (ref 26.6–33.0)
MCHC: 30.6 g/dL — ABNORMAL LOW (ref 31.5–35.7)
MCV: 79 fL (ref 79–97)
Monocytes Absolute: 0.7 10*3/uL (ref 0.1–0.9)
Monocytes: 11 %
Neutrophils Absolute: 4.2 10*3/uL (ref 1.4–7.0)
Neutrophils: 66 %
Phosphorus: 3.3 mg/dL (ref 2.8–4.1)
Platelets: 224 10*3/uL (ref 150–450)
Potassium: 4.7 mmol/L (ref 3.5–5.2)
Prostate Specific Ag, Serum: 3.4 ng/mL (ref 0.0–4.0)
RBC: 6.67 x10E6/uL — ABNORMAL HIGH (ref 4.14–5.80)
RDW: 16.9 % — ABNORMAL HIGH (ref 11.6–15.4)
Sodium: 139 mmol/L (ref 134–144)
T3 Uptake Ratio: 30 % (ref 24–39)
T4, Total: 6.3 ug/dL (ref 4.5–12.0)
TSH: 1.95 u[IU]/mL (ref 0.450–4.500)
Total Protein: 6.4 g/dL (ref 6.0–8.5)
Triglycerides: 104 mg/dL (ref 0–149)
Uric Acid: 3.2 mg/dL — ABNORMAL LOW (ref 3.8–8.4)
VLDL Cholesterol Cal: 19 mg/dL (ref 5–40)
WBC: 6.4 10*3/uL (ref 3.4–10.8)
eGFR: 66 mL/min/{1.73_m2} (ref >59)

## 2023-05-11 ENCOUNTER — Ambulatory Visit: Payer: Self-pay | Admitting: Physician Assistant

## 2023-05-11 ENCOUNTER — Encounter: Payer: Self-pay | Admitting: Physician Assistant

## 2023-05-11 VITALS — BP 133/83 | HR 89 | Temp 98.2°F | Resp 14 | Ht 70.5 in | Wt 217.0 lb

## 2023-05-11 DIAGNOSIS — Z0000 Encounter for general adult medical examination without abnormal findings: Secondary | ICD-10-CM

## 2023-05-11 NOTE — Progress Notes (Signed)
[facility] ____________________________________________   None    (approximate)  I have reviewed the triage vital signs and the nursing notes.   HISTORY  Chief Complaint Annual Exam  HPI Benjamin Carrillo is a 52 y.o. male patient presents for annual physical exam.  Patient was concerned for left inguinal discomfort.  Patient had a left inguinal hernia surgery approximate 14 years ago.  States he is feeling discomfort in the area of the incision site.  Patient also has a history of chronic diarrhea.         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   IBS (irritable bowel syndrome)    Low HDL (under 40)    Low testosterone    MI (myocardial infarction) ([facility])    Pinched nerve    L4   Polycythemia     Patient Active Problem List   Diagnosis Date Noted   Lumbar spinal stenosis 11/30/2019   Anxiety state 05/25/2019   Hypogonadism in male 05/25/2019   CAD (coronary artery disease)    Other hyperlipidemia    ST elevation (STEMI) myocardial infarction involving right coronary artery ([facility]) 09/08/2015    Past Surgical History:  Procedure Laterality Date   CARDIAC CATHETERIZATION N/A 09/08/2015   Procedure: Left Heart Cath and Coronary Angiography;  Surgeon: Peter M Swaziland, MD;  Location: [facility];  Service: Cardiovascular;  Laterality: N/A;   CARDIAC CATHETERIZATION N/A 09/08/2015   Procedure: Coronary Stent Intervention;  Surgeon: Peter M Swaziland, MD;  Location: [facility];  Service: Cardiovascular;  Laterality: N/A;  mid rca    CORONARY ANGIOPLASTY WITH STENT PLACEMENT     INGUINAL HERNIA REPAIR     NM RENAL LASIX ([facility] HX) Bilateral 08/1997    Prior to Admission medications   Medication Sig Start Date End Date Taking?  Authorizing Provider  ALPRAZolam Prudy Feeler) 0.5 MG tablet Take 1 tablet (0.5 mg total) by mouth daily as needed. Use sparingly. 05/12/21  Yes Joni Reining, PA-C  aspirin 81 MG chewable tablet Chew 1 tablet (81 mg total) by mouth daily. 09/10/15  Yes Janetta Hora, PA-C  Glucosamine HCl (GLUCOSAMINE PO) Take by mouth.   Yes [provider]  Nutritional Supplements (DHEA PO) Take 1 tablet by mouth daily.   Yes [provider]  QUERCETIN PO Take by mouth.   Yes [provider]  testosterone cypionate (DEPOTESTOSTERONE CYPIONATE) 200 MG/ML injection INJECT 0.5 ML EVERY 14 DAYS 09/17/21  Yes Joni Reining, PA-C  traMADol (ULTRAM) 50 MG tablet Take 1 tablet (50 mg total) by mouth every 6 (six) hours as needed. 08/25/21  Yes Joni Reining, PA-C    Allergies Penicillins and Erythromycin  Family History  Problem Relation Age of Onset   CAD Maternal Grandfather    CAD Paternal Grandfather    COPD Mother    Stroke Father    Heart disease Sister     Social History Social History   Tobacco Use   Smoking status: Never   Smokeless tobacco: Never  Substance Use Topics   Alcohol use: Yes    Alcohol/week: 0.0 standard drinks of alcohol    Comment: Rare   Drug use: No    Review of Systems  Constitutional: No fever/chills Eyes: No visual changes. ENT: No sore throat. Cardiovascular: Denies chest pain.  Coronary artery disease Respiratory: Denies shortness of breath. Gastrointestinal: No abdominal pain.  No nausea, no vomiting.  No diarrhea.  No constipation. Genitourinary: Negative for dysuria.  Hypogonadism Musculoskeletal: Negative for back pain. Skin: Negative for rash. Neurological: Negative for headaches, focal weakness or numbness. Psychiatric: Anxiety Endocrine: Hyperlipidemia Allergic/Immunilogical: Penicillin and erythromycin  ____________________________________________   PHYSICAL EXAM:  VITAL SIGNS: BP 133/83  Pulse 89  Resp 14  Temp  98.2 F (36.8 C)  Temp src Temporal  SpO2 96 %  Weight 217 lb (98.4 kg)  Height 5' 10.5" (1.791 m)   BMI 30.70 kg/m2  BSA 2.21 m2   Constitutional: Alert and oriented. Well appearing and in no acute distress. Eyes: Conjunctivae are normal. PERRL. EOMI. Head: Atraumatic. Nose: No congestion/rhinnorhea. Mouth/Throat: Mucous membranes are moist.  Oropharynx non-erythematous. Neck: No stridor.  No cervical spine tenderness to palpation. Hematological/Lymphatic/Immunilogical: No cervical lymphadenopathy. Cardiovascular: Normal rate, regular rhythm. Grossly normal heart sounds.  Good peripheral circulation. Respiratory: Normal respiratory effort.  No retractions. Lungs CTAB. Gastrointestinal: Soft and nontender. No distention. No abdominal bruits. No CVA tenderness. Genitourinary: Deferred Musculoskeletal: No lower extremity tenderness nor edema.  No joint effusions. Neurologic:  Normal speech and language. No gross focal neurologic deficits are appreciated. No gait instability. Skin:  Skin is warm, dry and intact. No rash noted. Psychiatric: Mood and affect are normal. Speech and behavior are normal.  ____________________________________________   LABS  Ref Range & Units 2 yr ago (05/06/21) 3 yr ago (05/02/20) 3 yr ago (05/17/19)  Color, UA Light Yellow yellow Pale Yellow  Clarity, UA Clear clear Clear  Glucose, UA Negative Negative Negative Negative  Bilirubin, UA Negative negative Negative  Ketones, UA Negative negative Negative  Spec Grav, UA 1.010 - 1.025 1.010 1.010 1.010  Blood, UA Negative negative Negative  pH, UA 5.0 - 8.0 6.0 6.0 6.0  Protein, UA Negative Negative Negative Negative  Urobilinogen, UA 0.2 or 1.0 E.U./dL 0.2 0.2 0.2  Nitrite, UA Negative negative Negative  Leukocytes, UA Negative Negative Negative Negative  Appearance  light   Odor            Specimen Collected: 05/06/21 09:01 Last Resulted: 05/06/21 09:01      Lab Flowsheet      Order  Details      View Encounter      Lab and Collection Details      Routing      Result History    View All Conversations on this Encounter        Result Care Coordination   Patient Communication   Add Comments   Seen Back to Top    Other Results from 05/06/2021   Contains abnormal data CMP12+LP+TP+TSH+6AC+PSA+CBC. Order: [id number] Status: Final result      Visible to patient: Yes (seen)      Next appt: None      Dx: Routine adult health maintenance    0 Result Notes    important suggestion  Newer results are available. Click to view them now.             Component Ref Range & Units 2 yr ago (05/06/21) 3 yr ago (05/02/20) 3 yr ago (08/17/19) 3 yr ago (05/17/19) 4 yr ago (03/24/19) 4 yr ago (03/06/19) 4 yr ago (05/17/18)  Glucose 65 - 99 mg/dL 87 84  89     Uric Acid 3.8 - 8.4 mg/dL 4.0  4.2 CM  3.7 R, CM     Comment:            Therapeutic target for gout patients: <6.0  BUN 6 - 24 mg/dL 15 16  15      Creatinine, Ser 0.76 - 1.27 mg/dL 2.95 High  6.21  3.08 High      eGFR >59 mL/min/1.73 64        BUN/Creatinine Ratio 9 - 20 11 13  12      Sodium 134 - 144 mmol/L 139 137  139     Potassium 3.5 - 5.2 mmol/L 4.4 4.3  4.6     Chloride 96 - 106 mmol/L 103 102  103     Calcium 8.7 - 10.2 mg/dL 9.1 9.4  9.5     Phosphorus 2.8 - 4.1 mg/dL 3.0 3.0  3.0     Total Protein 6.0 - 8.5 g/dL 6.4 7.0  6.8     Albumin 3.8 - 4.9 g/dL 4.6 4.8  4.5 R     Globulin, Total 1.5 - 4.5 g/dL 1.8 2.2  2.3     Albumin/Globulin Ratio 1.2 - 2.2 2.6 High  2.2  2.0     Bilirubin Total 0.0 - 1.2 mg/dL 0.4 0.5  0.4     Alkaline Phosphatase 44 - 121 IU/L 60 82 R  63 R     LDH 121 - 224 IU/L 157 199  176     AST 0 - 40 IU/L 26 43 High   28     ALT 0 - 44 IU/L 38 70 High   33     GGT 0 - 65 IU/L 27 65  28     Iron 38 - 169 ug/dL 26 Low  31 Low   26 Low      Cholesterol, Total 100 - 199 mg/dL 657 High  846  962 High      Triglycerides 0 - 149 mg/dL 952 841  324   99 R   HDL >39 mg/dL 33 Low  35 Low   32 Low    40 R  VLDL Cholesterol Cal 5 - 40 mg/dL 24 24  18      LDL Chol Calc (NIH) 0 - 99 mg/dL 401 High  90  027 High      Chol/HDL Ratio 0.0 - 5.0 ratio 6.1 High  4.3 CM  6.3 High  CM     Comment:                                   T. Chol/HDL Ratio                                             Men  Women                               1/2 Avg.Risk  3.4    3.3                                   Avg.Risk  5.0    4.4  2X Avg.Risk  9.6    7.1                                3X Avg.Risk 23.4   11.0  Estimated CHD Risk 0.0 - 1.0 times avg. 1.3 High  0.8 CM  1.3 High  CM     Comment: The CHD Risk is based on the T. Chol/HDL ratio. Other factors affect CHD Risk such as hypertension, smoking, diabetes, severe obesity, and family history of premature CHD.  TSH 0.450 - 4.500 uIU/mL 2.200 3.190  2.110     T4, Total 4.5 - 12.0 ug/dL 5.8 5.6  4.8     T3 Uptake Ratio 24 - 39 % 26 28  27      Free Thyroxine Index 1.2 - 4.9 1.5 1.6  1.3     Prostate Specific Ag, Serum 0.0 - 4.0 ng/mL 2.8 2.5 CM  1.9 CM     Comment: Roche ECLIA methodology. According to the American Urological Association, Serum PSA should decrease and remain at undetectable levels after radical prostatectomy. The AUA defines biochemical recurrence as an initial PSA value 0.2 ng/mL or greater followed by a subsequent confirmatory PSA value 0.2 ng/mL or greater. Values obtained with different assay methods or kits cannot be used interchangeably. Results cannot be interpreted as absolute evidence of the presence or absence of malignant disease.  WBC 3.4 - 10.8 x10E3/uL 6.9 7.7 5.3 6.3 8.0 7.2   RBC 4.14 - 5.80 x10E6/uL 6.35 High  6.27 High  6.03 High  6.12 High  5.81 High  6.64 High    Hemoglobin 13.0 - 17.7 g/dL 08.6 57.8 46.9 62.9 52.8 16.1   Hematocrit 37.5 - 51.0 % 46.5 46.4 45.7 46.6 44.8 54.0 High    MCV 79 - 97 fL 73 Low  74 Low  76 Low  76 Low  77 Low  81    MCH 26.6 - 33.0 pg 21.6 Low  22.3 Low  22.9 Low  23.7 Low  24.6 Low  24.2 Low    MCHC 31.5 - 35.7 g/dL 41.3 Low  24.4 Low  01.0 Low  31.1 Low  31.9 29.8 Low    RDW 11.6 - 15.4 % 17.6 High  18.6 High  14.6 14.8 14.5 17.1 High    Platelets 150 - 450 x10E3/uL 294 237 265 313 269 247   Neutrophils Not Estab. % 63 62 61 58 62 64   Lymphs Not Estab. % 17 21 21 20 18 18    Monocytes Not Estab. % 10 11 9 11 11 10    Eos Not Estab. % 9 5 8 10 8 7    Basos Not Estab. % 1 1 1 1 1 1    Neutrophils Absolute 1.4 - 7.0 x10E3/uL 4.4 4.8 3.2 3.6 5.0 4.6   Lymphocytes Absolute 0.7 - 3.1 x10E3/uL 1.2 1.6 1.1 1.3 1.4 1.3   Monocytes Absolute 0.1 - 0.9 x10E3/uL 0.7 0.8 0.5 0.7 0.9 0.7   EOS (ABSOLUTE) 0.0 - 0.4 x10E3/uL 0.6 High  0.4 0.4 0.6 High  0.6 High  0.5 High    Basophils Absolute 0.0 - 0.2 x10E3/uL 0.1 0.0 0.1 0.1 0.1 0.1   Immature Granulocytes Not Estab. % 0 0 0 0 0 0   Immature Grans (Abs)            ____________________________________________  EKG  Normal sinus rhythm at 67 bpm ____________________________________________    ____________________________________________   INITIAL  IMPRESSION / ASSESSMENT AND PLAN  As part of my medical decision making, I reviewed the following data within the electronic MEDICAL RECORD NUMBER      No acute findings on physical exam.  Discussed elevated cholesterol.  Patient will receive a consult for gastroenterology for reevaluation of left inguinal surgical site.        ____________________________________________   FINAL CLINICAL IMPRESSION Well exam   ED Discharge Orders     None        Note:  This document was prepared using Dragon voice recognition software and may include unintentional dictation errors.

## 2023-05-11 NOTE — Progress Notes (Signed)
Pt presents today to complete physical, Pt would like to discuss stomach issues.

## 2024-05-09 ENCOUNTER — Ambulatory Visit: Payer: Self-pay

## 2024-05-09 DIAGNOSIS — Z0000 Encounter for general adult medical examination without abnormal findings: Secondary | ICD-10-CM

## 2024-05-09 LAB — POCT URINALYSIS DIPSTICK
Bilirubin, UA: 1 — AB
Blood, UA: NEGATIVE
Glucose, UA: NEGATIVE
Ketones, UA: NEGATIVE
Leukocytes, UA: NEGATIVE
Nitrite, UA: NEGATIVE
Protein, UA: POSITIVE — AB
Spec Grav, UA: >=1.03 — AB (ref 1.010–1.025)
Urobilinogen, UA: 0.2 U/dL
pH, UA: 6 (ref 5.0–8.0)

## 2024-05-10 LAB — CMP12+LP+TP+TSH+6AC+PSA+CBC…
ALT: 45 IU/L — ABNORMAL HIGH (ref 0–44)
AST: 29 IU/L (ref 0–40)
Albumin: 4.3 g/dL (ref 3.8–4.9)
Alkaline Phosphatase: 66 IU/L (ref 44–121)
BUN/Creatinine Ratio: 14 (ref 9–20)
BUN: 18 mg/dL (ref 6–24)
Basophils Absolute: 0.1 x10E3/uL (ref 0.0–0.2)
Basos: 1 %
Bilirubin Total: 0.5 mg/dL (ref 0.0–1.2)
Calcium: 9.4 mg/dL (ref 8.7–10.2)
Chloride: 103 mmol/L (ref 96–106)
Chol/HDL Ratio: 6.1 ratio — ABNORMAL HIGH (ref 0.0–5.0)
Cholesterol, Total: 208 mg/dL — ABNORMAL HIGH (ref 100–199)
Creatinine, Ser: 1.31 mg/dL — ABNORMAL HIGH (ref 0.76–1.27)
EOS (ABSOLUTE): 0.2 x10E3/uL (ref 0.0–0.4)
Eos: 3 %
Estimated CHD Risk: 1.3 times avg. — ABNORMAL HIGH (ref 0.0–1.0)
Free Thyroxine Index: 1.5 (ref 1.2–4.9)
GGT: 21 IU/L (ref 0–65)
Globulin, Total: 2.6 g/dL (ref 1.5–4.5)
Glucose: 80 mg/dL (ref 70–99)
HDL: 34 mg/dL — ABNORMAL LOW (ref >39)
Hematocrit: 52.8 % — ABNORMAL HIGH (ref 37.5–51.0)
Hemoglobin: 15.6 g/dL (ref 13.0–17.7)
Immature Grans (Abs): 0 x10E3/uL (ref 0.0–0.1)
Immature Granulocytes: 0 %
Iron: 30 ug/dL — ABNORMAL LOW (ref 38–169)
LDH: 168 IU/L (ref 121–224)
LDL Chol Calc (NIH): 158 mg/dL — ABNORMAL HIGH (ref 0–99)
Lymphocytes Absolute: 1.3 x10E3/uL (ref 0.7–3.1)
Lymphs: 17 %
MCH: 24.3 pg — ABNORMAL LOW (ref 26.6–33.0)
MCHC: 29.5 g/dL — ABNORMAL LOW (ref 31.5–35.7)
MCV: 82 fL (ref 79–97)
Monocytes Absolute: 0.9 x10E3/uL (ref 0.1–0.9)
Monocytes: 12 %
Neutrophils Absolute: 5.3 x10E3/uL (ref 1.4–7.0)
Neutrophils: 67 %
Phosphorus: 2.9 mg/dL (ref 2.8–4.1)
Platelets: 311 x10E3/uL (ref 150–450)
Potassium: 4.6 mmol/L (ref 3.5–5.2)
Prostate Specific Ag, Serum: 4.9 ng/mL — ABNORMAL HIGH (ref 0.0–4.0)
RBC: 6.43 x10E6/uL — ABNORMAL HIGH (ref 4.14–5.80)
RDW: 17.9 % — ABNORMAL HIGH (ref 11.6–15.4)
Sodium: 140 mmol/L (ref 134–144)
T3 Uptake Ratio: 27 % (ref 24–39)
T4, Total: 5.4 ug/dL (ref 4.5–12.0)
TSH: 3.37 u[IU]/mL (ref 0.450–4.500)
Total Protein: 6.9 g/dL (ref 6.0–8.5)
Triglycerides: 87 mg/dL (ref 0–149)
Uric Acid: 2.8 mg/dL — ABNORMAL LOW (ref 3.8–8.4)
VLDL Cholesterol Cal: 16 mg/dL (ref 5–40)
WBC: 7.8 x10E3/uL (ref 3.4–10.8)
eGFR: 64 mL/min/1.73 (ref >59)

## 2024-05-11 ENCOUNTER — Ambulatory Visit: Payer: Self-pay | Admitting: Physician Assistant

## 2024-05-11 ENCOUNTER — Encounter: Payer: Self-pay | Admitting: Physician Assistant

## 2024-05-11 DIAGNOSIS — Z0000 Encounter for general adult medical examination without abnormal findings: Secondary | ICD-10-CM

## 2024-05-11 NOTE — Progress Notes (Signed)
 [facility] ____________________________________________   None    (approximate)  I have reviewed the triage vital signs and the nursing notes.   HISTORY  Chief Complaint No chief complaint on file.   HPI Benjamin Carrillo is a 53 y.o. male patient presents for annual physical exam.  Patient voiced no concerns or complaints.         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   IBS (irritable bowel syndrome)    Low HDL (under 40)    Low testosterone     MI (myocardial infarction) (HCC)    Pinched nerve    L4   Polycythemia     Patient Active Problem List   Diagnosis Date Noted   Lumbar spinal stenosis 11/30/2019   Anxiety state 05/25/2019   Hypogonadism in male 05/25/2019   CAD (coronary artery disease)    Other hyperlipidemia    ST elevation (STEMI) myocardial infarction involving right coronary artery (HCC) 09/08/2015    Past Surgical History:  Procedure Laterality Date   CARDIAC CATHETERIZATION N/A 09/08/2015   Procedure: Left Heart Cath and Coronary Angiography;  Surgeon: Peter M Swaziland, MD;  Location: [facility] INVASIVE CV LAB;  Service: Cardiovascular;  Laterality: N/A;   CARDIAC CATHETERIZATION N/A 09/08/2015   Procedure: Coronary Stent Intervention;  Surgeon: Peter M Swaziland, MD;  Location: [facility] INVASIVE CV LAB;  Service: Cardiovascular;  Laterality: N/A;  mid rca    CORONARY ANGIOPLASTY WITH STENT PLACEMENT     INGUINAL HERNIA REPAIR     NM RENAL LASIX  ([facility] HX) Bilateral 08/1997    Prior to Admission medications   Medication Sig Start Date End Date Taking? Authorizing Provider  ALPRAZolam  (XANAX ) 0.5 MG tablet Take 1 tablet (0.5 mg total) by mouth daily as needed. Use sparingly. 05/12/21   Claudene Tanda POUR, PA-C  aspirin  81 MG chewable  tablet Chew 1 tablet (81 mg total) by mouth daily. 09/10/15   Sebastian Lamarr SAUNDERS, PA-C  Glucosamine HCl (GLUCOSAMINE PO) Take by mouth.    [provider]  Nutritional Supplements (DHEA PO) Take 1 tablet by mouth daily.    [provider]  QUERCETIN PO Take by mouth.    [provider]  testosterone  cypionate (DEPOTESTOSTERONE CYPIONATE) 200 MG/ML injection INJECT 0.5 ML EVERY 14 DAYS 09/17/21   Claudene Tanda POUR, PA-C  traMADol  (ULTRAM ) 50 MG tablet Take 1 tablet (50 mg total) by mouth every 6 (six) hours as needed. 08/25/21   Claudene Tanda POUR, PA-C    Allergies Penicillins and Erythromycin  Family History  Problem Relation Age of Onset   CAD Maternal Grandfather    CAD Paternal Grandfather    COPD Mother    Stroke Father    Heart disease Sister     Social History Social History   Tobacco Use   Smoking status: Never   Smokeless tobacco: Never  Substance Use Topics   Alcohol use: Yes    Alcohol/week: 0.0 standard drinks of alcohol    Comment: Rare   Drug use: No    Review of Systems Constitutional: No fever/chills Eyes: No visual changes. ENT: No sore throat. Cardiovascular: Denies chest pain. Respiratory: Denies shortness of breath. Gastrointestinal: No abdominal pain.  No nausea, no vomiting.  No diarrhea.  No constipation. Genitourinary: Negative for dysuria.  Hypogonadism Musculoskeletal: Negative for back pain. Skin: Negative for rash. Neurological: Negative for headaches, focal weakness or numbness. Endocrine: Hyperlipidemia  Allergic/Immunilogical: Erythromycin and penicillin  ____________________________________________   PHYSICAL EXAM:  VITAL SIGNS: BP 133/85  Cuff Size Large  Pulse Rate 85  Weight 222 lb (100.7 kg)  Height 5' 10 (1.778 m)  Resp 14  SpO2 96 %   BMI: 31.85 kg/m2  BSA: 2.23 m2   Constitutional: Alert and oriented. Well appearing and in no acute distress. Eyes: Conjunctivae are normal. PERRL. EOMI. Head:  Atraumatic. Nose: No congestion/rhinnorhea. Mouth/Throat: Mucous membranes are moist.  Oropharynx non-erythematous. Neck: No stridor.  No cervical spine tenderness to palpation. Hematological/Lymphatic/Immunilogical: No cervical lymphadenopathy. Cardiovascular: Normal rate, regular rhythm. Grossly normal heart sounds.  Good peripheral circulation. Respiratory: Normal respiratory effort.  No retractions. Lungs CTAB. Gastrointestinal: Soft and nontender. No distention. No abdominal bruits. No CVA tenderness. Genitourinary: Deferred Musculoskeletal: No lower extremity tenderness nor edema.  No joint effusions. Neurologic:  Normal speech and language. No gross focal neurologic deficits are appreciated. No gait instability. Skin:  Skin is warm, dry and intact. No rash noted. Psychiatric: Mood and affect are normal. Speech and behavior are normal.  ____________________________________________   LABS          Component Ref Range & Units (hover) 2 d ago (05/09/24) 1 yr ago (05/06/23) 2 yr ago (04/14/22) 3 yr ago (05/06/21) 4 yr ago (05/02/20) 4 yr ago (05/17/19)  Color, UA dark yellow yellow yellow Light Yellow yellow Pale Yellow  Clarity, UA clear clear clear Clear clear Clear  Glucose, UA Negative Negative Negative Negative Negative Negative  Bilirubin, UA 1 Abnormal  neg neg Negative negative Negative  Ketones, UA neg neg neg Negative negative Negative  Spec Grav, UA >=1.030 Abnormal  1.010 1.015 1.010 1.010 1.010  Blood, UA neg neg neg Negative negative Negative  pH, UA 6.0 6.0 6.0 6.0 6.0 6.0  Protein, UA Positive Abnormal  Negative Negative Negative Negative Negative  Comment: +- trace  Urobilinogen, UA 0.2 0.2 0.2 0.2 0.2 0.2  Nitrite, UA neg neg neg Negative negative Negative  Leukocytes, UA Negative Negative Negative Negative Negative Negative  Appearance     light   Odor                      Component Ref Range & Units (hover) 2 d ago (05/09/24) 1 yr ago (05/06/23) 2 yr  ago (04/14/22) 3 yr ago (05/06/21) 4 yr ago (05/02/20) 4 yr ago (08/17/19) 4 yr ago (05/17/19)  Glucose 80 83 94 87 R 84 R  89 R  Uric Acid 2.8 Low  3.2 Low  CM 3.4 Low  CM 4.0 CM 4.2 CM  3.7 R, CM  Comment:            Therapeutic target for gout patients: <6.0  BUN 18 23 18 15 16  15   Creatinine, Ser 1.31 High  1.29 High  1.31 High  1.33 High  1.19  1.29 High   eGFR 64 66 65 64     BUN/Creatinine Ratio 14 18 14 11 13  12   Sodium 140 139 138 139 137  139  Potassium 4.6 4.7 4.5 4.4 4.3  4.6  Chloride 103 103 102 103 102  103  Calcium  9.4 9.1 9.5 9.1 9.4  9.5  Phosphorus 2.9 3.3 3.4 3.0 3.0  3.0  Total Protein 6.9 6.4 6.6 6.4 7.0  6.8  Albumin 4.3 4.5 4.4 4.6 4.8  4.5 R  Globulin, Total 2.6 1.9 2.2 1.8 2.2  2.3  Bilirubin Total 0.5 0.3 0.6 0.4 0.5  0.4  Alkaline Phosphatase 66 50 65 60 82 R  63 R  LDH 168 154 146 157 199  176  AST 29 36 30 26 43 High   28  ALT 45 High  78 High  37 38 70 High   33  GGT 21 31 20 27  65  28  Iron 30 Low  28 Low  22 Low  26 Low  31 Low   26 Low   Cholesterol, Total 208 High  197 166 201 High  149  202 High   Triglycerides 87 104 128 131 135  100  HDL 34 Low  29 Low  29 Low  33 Low  35 Low   32 Low   VLDL Cholesterol Cal 16 19 23 24 24  18   LDL Chol Calc (NIH) 158 High  149 High  114 High  144 High  90  152 High   Chol/HDL Ratio 6.1 High  6.8 High  CM 5.7 High  CM 6.1 High  CM 4.3 CM  6.3 High  CM  Comment:                                   T. Chol/HDL Ratio                                             Men  Women                               1/2 Avg.Risk  3.4    3.3                                   Avg.Risk  5.0    4.4                                2X Avg.Risk  9.6    7.1                                3X Avg.Risk 23.4   11.0  Estimated CHD Risk 1.3 High  1.4 High  CM 1.2 High  CM 1.3 High  CM 0.8 CM  1.3 High  CM  Comment: The CHD Risk is based on the T. Chol/HDL ratio. Other factors affect CHD Risk such as hypertension, smoking, diabetes, severe  obesity, and family history of premature CHD.  TSH 3.370 1.950 1.840 2.200 3.190  2.110  T4, Total 5.4 6.3 5.7 5.8 5.6  4.8  T3 Uptake Ratio 27 30 29 26 28  27   Free Thyroxine Index 1.5 1.9 1.7 1.5 1.6  1.3  Prostate Specific Ag, Serum 4.9 High  3.4 CM 3.0 CM 2.8 CM 2.5 CM  1.9 CM  Comment: Roche ECLIA methodology. According to the American U[employer] Association, Serum PSA should decrease and remain at undetectable levels after radical prostatectomy. The AUA defines biochemical recurrence as an initial PSA  value 0.2 ng/mL or greater followed by a subsequent confirmatory PSA value 0.2 ng/mL or greater. Values obtained with different assay methods or kits cannot be used interchangeably. Results cannot be interpreted as absolute evidence of the presence or absence of malignant disease.  WBC 7.8 6.4 7.4 6.9 7.7 5.3 6.3  RBC 6.43 High  6.67 High  6.31 High  6.35 High  6.27 High  6.03 High  6.12 High   Hemoglobin 15.6 16.1 13.1 13.7 14.0 13.8 14.5  Hematocrit 52.8 High  52.7 High  45.3 46.5 46.4 45.7 46.6  MCV 82 79 72 Low  73 Low  74 Low  76 Low  76 Low   MCH 24.3 Low  24.1 Low  20.8 Low  21.6 Low  22.3 Low  22.9 Low  23.7 Low   MCHC 29.5 Low  30.6 Low  28.9 Low  29.5 Low  30.2 Low  30.2 Low  31.1 Low   RDW 17.9 High  16.9 High  17.1 High  17.6 High  18.6 High  14.6 14.8  Platelets 311 224 277 294 237 265 313  Neutrophils 67 66 66 63 62 61 58  Lymphs 17 16 14 17 21 21 20   Monocytes 12 11 11 10 11 9 11   Eos 3 6 8 9 5 8 10   Basos 1 1 1 1 1 1 1   Neutrophils Absolute 5.3 4.2 5.0 4.4 4.8 3.2 3.6  Lymphocytes Absolute 1.3 1.0 1.0 1.2 1.6 1.1 1.3  Monocytes Absolute 0.9 0.7 0.8 0.7 0.8 0.5 0.7  EOS (ABSOLUTE) 0.2 0.4 0.6 High  0.6 High  0.4 0.4 0.6 High   Basophils Absolute 0.1 0.1 0.0 0.1 0.0 0.1 0.1  Immature Granulocytes 0 0 0 0 0 0 0  Immature Grans (Abs) 0.0 0.0 0.0 0.0 0.0 0.0 0.0                   ____________________________________________  EKG  Normal sinus rhythm at 86  bpm.  Left atrial enlargement. ____________________________________________     ____________________________________________   INITIAL IMPRESSION / ASSESSMENT AND PLAN   As part of my medical decision making, I reviewed the following data within the electronic MEDICAL RECORD NUMBER      No acute findings on physical exam, EKG, labs.  Vies continue previous medications.        ____________________________________________   FINAL CLINICAL IMPRESSION  Well exam   ED Discharge Orders     None        Note:  This document was prepared using Dragon voice recognition software and may include unintentional dictation errors.

## 2024-05-11 NOTE — Progress Notes (Signed)
 Pt presents today to complete physical, pt did not voice any concerns at this time. Benjamin Carrillo

## 2024-05-11 NOTE — Addendum Note (Signed)
 Addended by: CINDIE SMILES F on: 05/11/2024 01:06 PM   Modules accepted: Orders
